# Patient Record
Sex: Male | Born: 1960 | Race: White | Hispanic: No | Marital: Single | Smoking: Current every day smoker
Health system: Southern US, Community
[De-identification: ages and names within clinical notes are randomized; demographics above are authoritative.]

## PROBLEM LIST (undated history)

## (undated) DIAGNOSIS — F329 Major depressive disorder, single episode, unspecified: Secondary | ICD-10-CM

## (undated) DIAGNOSIS — E785 Hyperlipidemia, unspecified: Secondary | ICD-10-CM

## (undated) DIAGNOSIS — R0601 Orthopnea: Secondary | ICD-10-CM

## (undated) DIAGNOSIS — G4730 Sleep apnea, unspecified: Secondary | ICD-10-CM

## (undated) DIAGNOSIS — J3489 Other specified disorders of nose and nasal sinuses: Secondary | ICD-10-CM

## (undated) DIAGNOSIS — I10 Essential (primary) hypertension: Secondary | ICD-10-CM

## (undated) DIAGNOSIS — F209 Schizophrenia, unspecified: Secondary | ICD-10-CM

## (undated) DIAGNOSIS — F410 Panic disorder [episodic paroxysmal anxiety] without agoraphobia: Secondary | ICD-10-CM

## (undated) DIAGNOSIS — G4700 Insomnia, unspecified: Secondary | ICD-10-CM

## (undated) DIAGNOSIS — R0600 Dyspnea, unspecified: Secondary | ICD-10-CM

## (undated) DIAGNOSIS — F32A Depression, unspecified: Secondary | ICD-10-CM

## (undated) HISTORY — PX: APPENDECTOMY: SHX54

## (undated) HISTORY — PX: NOSE SURGERY: SHX723

## (undated) HISTORY — DX: Schizophrenia, unspecified: F20.9

## (undated) HISTORY — DX: Panic disorder (episodic paroxysmal anxiety): F41.0

## (undated) HISTORY — DX: Hyperlipidemia, unspecified: E78.5

## (undated) HISTORY — DX: Essential (primary) hypertension: I10

---
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
## 1998-12-31 HISTORY — PX: NOSE SURGERY: SHX723

## 2013-05-02 LAB — COMPREHENSIVE METABOLIC PANEL
Albumin: 4.2 g/dL (ref 3.4–5.0)
Anion Gap: 8 (ref 7–16)
BUN: 7 mg/dL (ref 7–18)
Bilirubin,Total: 0.5 mg/dL (ref 0.2–1.0)
Co2: 25 mmol/L (ref 21–32)
EGFR (African American): >60
Osmolality: 277 (ref 275–301)
Potassium: 2.8 mmol/L — ABNORMAL LOW (ref 3.5–5.1)
SGOT(AST): 28 U/L (ref 15–37)
SGPT (ALT): 26 U/L (ref 12–78)
Sodium: 139 mmol/L (ref 136–145)
Total Protein: 8.2 g/dL (ref 6.4–8.2)

## 2013-05-02 LAB — URINALYSIS, COMPLETE
Blood: NEGATIVE
Ketone: NEGATIVE
Ph: 6 (ref 4.5–8.0)
Protein: NEGATIVE
RBC,UR: NONE SEEN /HPF (ref 0–5)
Specific Gravity: 1.003 (ref 1.003–1.030)
Squamous Epithelial: NONE SEEN
WBC UR: 1 /HPF (ref 0–5)

## 2013-05-02 LAB — CBC
HCT: 42.5 % (ref 40.0–52.0)
HGB: 14.6 g/dL (ref 13.0–18.0)
MCHC: 34.5 g/dL (ref 32.0–36.0)
MCV: 87 fL (ref 80–100)
Platelet: 336 10*3/uL (ref 150–440)
WBC: 11.7 10*3/uL — ABNORMAL HIGH (ref 3.8–10.6)

## 2013-05-02 LAB — DRUG SCREEN, URINE
Phencyclidine (PCP) Ur S: NEGATIVE (ref ?–25)
Tricyclic, Ur Screen: NEGATIVE (ref ?–1000)

## 2013-05-02 LAB — ETHANOL: Ethanol: <3 mg/dL

## 2013-05-02 LAB — TSH: Thyroid Stimulating Horm: 0.89 u[IU]/mL

## 2013-05-03 ENCOUNTER — Inpatient Hospital Stay: Payer: Self-pay | Admitting: Psychiatry
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
## 2018-01-09 ENCOUNTER — Ambulatory Visit: Payer: Medicaid Other | Admitting: Nurse Practitioner

## 2018-01-09 ENCOUNTER — Encounter: Payer: Self-pay | Admitting: Nurse Practitioner

## 2018-01-09 VITALS — BP 140/80 | HR 95 | Resp 16 | Ht 67.0 in | Wt 240.2 lb

## 2018-01-09 DIAGNOSIS — I10 Essential (primary) hypertension: Secondary | ICD-10-CM | POA: Diagnosis not present

## 2018-01-09 DIAGNOSIS — G4700 Insomnia, unspecified: Secondary | ICD-10-CM | POA: Insufficient documentation

## 2018-01-09 DIAGNOSIS — F251 Schizoaffective disorder, depressive type: Secondary | ICD-10-CM | POA: Diagnosis not present

## 2018-01-09 DIAGNOSIS — E782 Mixed hyperlipidemia: Secondary | ICD-10-CM | POA: Diagnosis not present

## 2018-01-09 DIAGNOSIS — F17210 Nicotine dependence, cigarettes, uncomplicated: Secondary | ICD-10-CM

## 2018-01-09 DIAGNOSIS — J309 Allergic rhinitis, unspecified: Secondary | ICD-10-CM

## 2018-01-09 DIAGNOSIS — J3489 Other specified disorders of nose and nasal sinuses: Secondary | ICD-10-CM | POA: Diagnosis not present

## 2018-01-09 MED ORDER — FLUTICASONE PROPIONATE 50 MCG/ACT NA SUSP: 1.0000 | Freq: Two times a day (BID) | NASAL | 3 refills | Status: DC

## 2018-01-09 MED ORDER — ZOLPIDEM TARTRATE 10 MG PO TABS: 10.0000 mg | ORAL_TABLET | Freq: Every day | ORAL | 3 refills | Status: DC
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
## 2018-01-15 ENCOUNTER — Encounter: Payer: Self-pay | Admitting: Nurse Practitioner

## 2018-05-16 ENCOUNTER — Encounter: Payer: Self-pay | Admitting: Nurse Practitioner

## 2018-05-16 ENCOUNTER — Ambulatory Visit: Payer: Medicaid Other | Admitting: Nurse Practitioner

## 2018-05-16 VITALS — BP 111/84 | HR 86 | Resp 16 | Ht 67.0 in | Wt 236.0 lb

## 2018-05-16 DIAGNOSIS — F251 Schizoaffective disorder, depressive type: Secondary | ICD-10-CM | POA: Diagnosis not present

## 2018-05-16 DIAGNOSIS — I10 Essential (primary) hypertension: Secondary | ICD-10-CM | POA: Diagnosis not present

## 2018-05-16 DIAGNOSIS — J309 Allergic rhinitis, unspecified: Secondary | ICD-10-CM

## 2018-05-16 DIAGNOSIS — F411 Generalized anxiety disorder: Secondary | ICD-10-CM

## 2018-05-16 DIAGNOSIS — G4700 Insomnia, unspecified: Secondary | ICD-10-CM

## 2018-05-16 MED ORDER — CITALOPRAM HYDROBROMIDE 20 MG PO TABS: 20.0000 mg | ORAL_TABLET | Freq: Every day | ORAL | 5 refills | Status: DC

## 2018-05-16 MED ORDER — FLUTICASONE PROPIONATE 50 MCG/ACT NA SUSP: 1.0000 | Freq: Two times a day (BID) | NASAL | 3 refills | Status: DC

## 2018-05-16 MED ORDER — DOXYCYCLINE HYCLATE 100 MG PO TABS: 100.0000 mg | ORAL_TABLET | Freq: Two times a day (BID) | ORAL | 0 refills | Status: DC

## 2018-05-16 MED ORDER — ZOLPIDEM TARTRATE 10 MG PO TABS: 10.0000 mg | ORAL_TABLET | Freq: Every day | ORAL | 3 refills | Status: DC

## 2018-05-16 MED ORDER — LISINOPRIL-HYDROCHLOROTHIAZIDE 20-25 MG PO TABS: 1.0000 | ORAL_TABLET | ORAL | 5 refills | Status: DC

## 2018-05-16 MED ORDER — AMLODIPINE BESYLATE 5 MG PO TABS: 5.0000 mg | ORAL_TABLET | Freq: Every evening | ORAL | 5 refills | Status: DC

## 2018-05-16 MED ORDER — ATORVASTATIN CALCIUM 10 MG PO TABS: 10.0000 mg | ORAL_TABLET | Freq: Every evening | ORAL | 5 refills | Status: DC

## 2018-05-16 MED ORDER — BUSPIRONE HCL 15 MG PO TABS: 15.0000 mg | ORAL_TABLET | Freq: Two times a day (BID) | ORAL | 5 refills | Status: DC

## 2018-05-16 NOTE — Progress Notes (Signed)
[facility] [place], [place] [address]  Internal MEDICINE  Office Visit Note  Patient Name: Dustin Heath  [id number]  [id number]  Date of Service: 06/10/2018   Pt is here for routine follow up.   Chief Complaint  Patient presents with  . Tick Removal  . Recurrent Skin Infections  . Hypertension    The patient noted a tick bite on upper abdomen a few days ago. He was able to remove the tick. Got a small, round, red rash around area of bite. It did scab over and this fell off yesterday. He has also noted two cyst like lesions. One on each inner thigh. Come and go. Get very sore and red. Sometimes they will drain, but ultimately always come back.  Needs refills on routine medications. Blood pressure is doing well. He feels good, overall.       Current Medication: Outpatient Encounter Medications as of 05/16/2018  Medication Sig  . amLODipine (NORVASC) 5 MG tablet Take 1 tablet (5 mg total) by mouth every evening. Take 1-2 tablets.  Marland Kitchen atorvastatin (LIPITOR) 10 MG tablet Take 1 tablet (10 mg total) by mouth every evening.  . busPIRone (BUSPAR) 15 MG tablet Take 1 tablet (15 mg total) by mouth 2 (two) times daily.  . citalopram (CELEXA) 20 MG tablet Take 1 tablet (20 mg total) by mouth daily.  Marland Kitchen doxycycline (VIBRA-TABS) 100 MG tablet Take 1 tablet (100 mg total) by mouth 2 (two) times daily.  . fluticasone (FLONASE) 50 MCG/ACT nasal spray Place 1 spray into both nostrils 2 (two) times daily.  Marland Kitchen lisinopril-hydrochlorothiazide (PRINZIDE,ZESTORETIC) 20-25 MG tablet Take 1 tablet by mouth every morning.  . zolpidem (AMBIEN) 10 MG tablet Take 1 tablet (10 mg total) by mouth at bedtime.  . [DISCONTINUED] amLODipine (NORVASC) 5 MG tablet Take 5 mg by mouth every evening. Take 1-2 tablets.  . [DISCONTINUED] atorvastatin (LIPITOR) 10 MG tablet Take 10 mg by mouth every evening.  . [DISCONTINUED] busPIRone (BUSPAR) 15 MG tablet Take 15 mg by mouth 2 (two) times daily.   . [DISCONTINUED] citalopram (CELEXA) 20 MG tablet Take 20 mg by mouth daily.  . [DISCONTINUED] fluticasone (FLONASE) 50 MCG/ACT nasal spray Place 1 spray into both nostrils 2 (two) times daily.  . [DISCONTINUED] lisinopril-hydrochlorothiazide (PRINZIDE,ZESTORETIC) 20-25 MG tablet Take 1 tablet by mouth every morning.  . [DISCONTINUED] zolpidem (AMBIEN) 10 MG tablet Take 1 tablet (10 mg total) by mouth at bedtime.   No facility-administered encounter medications on file as of 05/16/2018.     Surgical History: Past Surgical History:  Procedure Laterality Date  . APPENDECTOMY    . NOSE SURGERY      Medical History: Past Medical History:  Diagnosis Date  . Hyperlipidemia   . Hypertension   . Panic disorder   . Schizophrenia ([place])     Family History: History reviewed. No pertinent family history.  Social History   Socioeconomic History  . Marital status: Single    Spouse name: Not on file  . Number of children: Not on file  . Years of education: Not on file  . Highest education level: Not on file  Occupational History  . Not on file  Social Needs  . Financial resource strain: Not on file  . Food insecurity:    Worry: Not on file    Inability: Not on file  . Transportation needs:    Medical: Not on file    Non-medical: Not on file  Tobacco Use  .  Smoking status: Current Every Day Smoker    Packs/day: 1.00    Years: 40.00    Pack years: 40.00    Types: Cigarettes  . Smokeless tobacco: Never Used  Substance and Sexual Activity  . Alcohol use: Not on file  . Drug use: Not on file  . Sexual activity: Not on file  Lifestyle  . Physical activity:    Days per week: Not on file    Minutes per session: Not on file  . Stress: Not on file  Relationships  . Social connections:    Talks on phone: Not on file    Gets together: Not on file    Attends religious service: Not on file    Active member of club or organization: Not on file    Attends meetings of clubs or  organizations: Not on file    Relationship status: Not on file  . Intimate partner violence:    Fear of current or ex partner: Not on file    Emotionally abused: Not on file    Physically abused: Not on file    Forced sexual activity: Not on file  Other Topics Concern  . Not on file  Social History Narrative  . Not on file      Review of Systems  Constitutional: Positive for fatigue. Negative for activity change, appetite change, chills and unexpected weight change.  HENT: Negative for congestion, postnasal drip, rhinorrhea, sneezing and sore throat.        Chronic congestion with difficulty breathing out of left nostril. Has been like this since he suffered nasal fracture.   Eyes: Negative.  Negative for redness.  Respiratory: Positive for apnea. Negative for cough, chest tightness, shortness of breath and wheezing.   Cardiovascular: Negative for chest pain and palpitations.  Gastrointestinal: Negative for abdominal pain, constipation, diarrhea, nausea and vomiting.  Endocrine: Negative for cold intolerance, heat intolerance, polydipsia, polyphagia and polyuria.  Genitourinary: Negative.  Negative for dysuria and frequency.  Musculoskeletal: Negative for arthralgias, back pain, joint swelling and neck pain.  Skin: Positive for rash.       Tick bite just over the naval. Also two cyst like lesion. One on each upper, inner thigh.  Allergic/Immunologic: Positive for environmental allergies.  Neurological: Positive for headaches. Negative for tremors and numbness.  Hematological: Negative.  Negative for adenopathy. Does not bruise/bleed easily.  Psychiatric/Behavioral: Positive for sleep disturbance. Negative for behavioral problems (Depression) and suicidal ideas. The patient is nervous/anxious.    Today's Vitals   05/16/18 1337  BP: 111/84  Pulse: 86  Resp: 16  SpO2: 96%  Weight: 236 lb (107 kg)  Height: 5\' 7"  (1.702 m)    Physical Exam  Constitutional: He is oriented to  person, place, and time. He appears well-developed and well-nourished. No distress.  HENT:  Head: Normocephalic and atraumatic.  Nose: Rhinorrhea, nasal deformity and septal deviation present.  Mouth/Throat: Oropharynx is clear and moist. No oropharyngeal exudate.  Eyes: Pupils are equal, round, and reactive to light. Conjunctivae and EOM are normal.  Neck: Normal range of motion. Neck supple. No JVD present. Carotid bruit is not present. No tracheal deviation present. No thyromegaly present.  Cardiovascular: Normal rate, regular rhythm and normal heart sounds. Exam reveals no gallop and no friction rub.  No murmur heard. Pulmonary/Chest: Effort normal and breath sounds normal. No respiratory distress. He has no wheezes. He has no rales. He exhibits no tenderness.  Abdominal: Soft. Bowel sounds are normal. There is no tenderness.  Musculoskeletal: Normal range of motion.  Lymphadenopathy:    He has no cervical adenopathy.  Neurological: He is alert and oriented to person, place, and time. No cranial nerve deficit.  Skin: Skin is warm and dry. He is not diaphoretic.  Psychiatric: His speech is normal and behavior is normal. Judgment and thought content normal. His mood appears anxious. He exhibits a depressed mood.  Nursing note and vitals reviewed.  Assessment/Plan: 1. Essential (primary) hypertension Stable. Continue blood pressure medications as prescribed - amLODipine (NORVASC) 5 MG tablet; Take 1 tablet (5 mg total) by mouth every evening. Take 1-2 tablets.  Dispense: 30 tablet; Refill: 5 - lisinopril-hydrochlorothiazide (PRINZIDE,ZESTORETIC) 20-25 MG tablet; Take 1 tablet by mouth every morning.  Dispense: 30 tablet; Refill: 5  2. Allergic rhinitis, unspecified seasonality, unspecified trigger Add fluticasone nasal spray.  - fluticasone (FLONASE) 50 MCG/ACT nasal spray; Place 1 spray into both nostrils 2 (two) times daily.  Dispense: 16 g; Refill: 3  3. Insomnia, unspecified  type May continue to take ambien 10mg  at bedtime as needed. New rx sent today.  - zolpidem (AMBIEN) 10 MG tablet; Take 1 tablet (10 mg total) by mouth at bedtime.  Dispense: 30 tablet; Refill: 3  4. Schizoaffective disorder, depressive type ([place]) Well managed. Continue citalopram as prescribed.  - citalopram (CELEXA) 20 MG tablet; Take 1 tablet (20 mg total) by mouth daily.  Dispense: 30 tablet; Refill: 5  5. Generalized anxiety disorder Buspirone 15mg  twice daily as needed for acute anxiety.  - busPIRone (BUSPAR) 15 MG tablet; Take 1 tablet (15 mg total) by mouth 2 (two) times daily.  Dispense: 60 tablet; Refill: 5  General Counseling: Casanova verbalizes understanding of the findings of todays visit and agrees with plan of treatment. I have discussed any further diagnostic evaluation that may be needed or ordered today. We also reviewed his medications today. he has been encouraged to call the office with any questions or concerns that should arise related to todays visit.    Counseling:  Hypertension Counseling:   The following hypertensive lifestyle modification were recommended and discussed:  1. Limiting alcohol intake to less than 1 oz/day of ethanol:(24 oz of beer or 8 oz of wine or 2 oz of 100-proof whiskey). 2. Take baby ASA 81 mg daily. 3. Importance of regular aerobic exercise and losing weight. 4. Reduce dietary saturated fat and cholesterol intake for overall cardiovascular health. 5. Maintaining adequate dietary potassium, calcium, and magnesium intake. 6. Regular monitoring of the blood pressure. 7. Reduce sodium intake to less than 100 mmol/day (less than 2.3 gm of sodium or less than 6 gm of sodium choride)   This patient was seen by Leretha Pol, FNP- C in Collaboration with Dr Lavera Guise as a part of collaborative care agreement  Meds ordered this encounter  Medications  . amLODipine (NORVASC) 5 MG tablet    Sig: Take 1 tablet (5 mg total) by mouth every evening.  Take 1-2 tablets.    Dispense:  30 tablet    Refill:  5    Order Specific Question:   Supervising Provider    Answer:   Lavera Guise [[id number]]  . atorvastatin (LIPITOR) 10 MG tablet    Sig: Take 1 tablet (10 mg total) by mouth every evening.    Dispense:  30 tablet    Refill:  5    Order Specific Question:   Supervising Provider    Answer:   Lavera Guise [[id number]]  . busPIRone (BUSPAR) 15  MG tablet    Sig: Take 1 tablet (15 mg total) by mouth 2 (two) times daily.    Dispense:  60 tablet    Refill:  5    Order Specific Question:   Supervising Provider    Answer:   Lavera Guise [[id number]]  . citalopram (CELEXA) 20 MG tablet    Sig: Take 1 tablet (20 mg total) by mouth daily.    Dispense:  30 tablet    Refill:  5    Order Specific Question:   Supervising Provider    Answer:   Lavera Guise [[id number]]  . fluticasone (FLONASE) 50 MCG/ACT nasal spray    Sig: Place 1 spray into both nostrils 2 (two) times daily.    Dispense:  16 g    Refill:  3    Order Specific Question:   Supervising Provider    Answer:   Lavera Guise [[id number]]  . lisinopril-hydrochlorothiazide (PRINZIDE,ZESTORETIC) 20-25 MG tablet    Sig: Take 1 tablet by mouth every morning.    Dispense:  30 tablet    Refill:  5    Order Specific Question:   Supervising Provider    Answer:   Lavera Guise [[id number]]  . zolpidem (AMBIEN) 10 MG tablet    Sig: Take 1 tablet (10 mg total) by mouth at bedtime.    Dispense:  30 tablet    Refill:  3    Order Specific Question:   Supervising Provider    Answer:   Lavera Guise [[id number]]  . doxycycline (VIBRA-TABS) 100 MG tablet    Sig: Take 1 tablet (100 mg total) by mouth 2 (two) times daily.    Dispense:  20 tablet    Refill:  0    Order Specific Question:   Supervising Provider    Answer:   Lavera Guise [[id number]]    Time spent: 41 Minutes     Dr Lavera Guise Internal medicine

## 2018-06-10 DIAGNOSIS — F411 Generalized anxiety disorder: Secondary | ICD-10-CM | POA: Insufficient documentation

## 2018-09-08 ENCOUNTER — Other Ambulatory Visit: Payer: Self-pay | Admitting: Nurse Practitioner

## 2018-09-08 DIAGNOSIS — G4700 Insomnia, unspecified: Secondary | ICD-10-CM

## 2018-09-08 MED ORDER — ZOLPIDEM TARTRATE 10 MG PO TABS: 10.0000 mg | ORAL_TABLET | Freq: Every day | ORAL | 3 refills | Status: DC

## 2018-09-08 NOTE — Progress Notes (Signed)
Renewed zolpidem rx per pharmacy request.

## 2018-09-25 ENCOUNTER — Encounter: Payer: Self-pay | Admitting: Adult Health

## 2018-09-25 ENCOUNTER — Ambulatory Visit: Payer: Medicaid Other | Admitting: Adult Health

## 2018-09-25 VITALS — BP 170/90 | HR 102 | Resp 16 | Ht 67.0 in | Wt 233.8 lb

## 2018-09-25 DIAGNOSIS — E782 Mixed hyperlipidemia: Secondary | ICD-10-CM

## 2018-09-25 DIAGNOSIS — H538 Other visual disturbances: Secondary | ICD-10-CM

## 2018-09-25 DIAGNOSIS — Z0001 Encounter for general adult medical examination with abnormal findings: Secondary | ICD-10-CM

## 2018-09-25 DIAGNOSIS — I10 Essential (primary) hypertension: Secondary | ICD-10-CM | POA: Diagnosis not present

## 2018-09-25 DIAGNOSIS — F411 Generalized anxiety disorder: Secondary | ICD-10-CM

## 2018-09-25 DIAGNOSIS — F17210 Nicotine dependence, cigarettes, uncomplicated: Secondary | ICD-10-CM

## 2018-09-25 DIAGNOSIS — R300 Dysuria: Secondary | ICD-10-CM

## 2018-09-25 DIAGNOSIS — G4700 Insomnia, unspecified: Secondary | ICD-10-CM

## 2018-09-25 DIAGNOSIS — Z125 Encounter for screening for malignant neoplasm of prostate: Secondary | ICD-10-CM

## 2018-09-25 DIAGNOSIS — F251 Schizoaffective disorder, depressive type: Secondary | ICD-10-CM

## 2018-09-25 NOTE — Patient Instructions (Signed)

## 2018-09-25 NOTE — Progress Notes (Signed)
[facility] [place], [place] [address]  Internal MEDICINE  Office Visit Note  Patient Name: Dustin Heath  [id number]  [id number]  Date of Service: 09/28/2018  Chief Complaint  Patient presents with  . Annual Exam  . Hypertension  . Hyperlipidemia  . Blurred Vision    blurred vision has got worse  . Anxiety     HPI Pt is here for routine health maintenance examination. He is a well appearing 58 yo male.  He has a history of HTN, HLD, Anxiety.  He reports he is currently having blurred vision, that has gotten worse over last 4 days.  His blood pressure is elevated today. He reports blurred vision has been present in one eye for a few years. Now his left eye is becoming blurry.  Pt is unsure of the diagnosis.  He needs to see and eye doctor, because he thinks he has macular degeneration. He has significant anxiety and this is most likely why his blood pressure is elevated. Pt reports he is taking a small part of his Buspar.  He reports smoking 1 PPD of cigarettes/ cigars. He denies alcohol or illicit drug use.      Current Medication: Outpatient Encounter Medications as of 09/25/2018  Medication Sig  . amLODipine (NORVASC) 5 MG tablet Take 1 tablet (5 mg total) by mouth every evening. Take 1-2 tablets.  Marland Kitchen atorvastatin (LIPITOR) 10 MG tablet Take 1 tablet (10 mg total) by mouth every evening.  . busPIRone (BUSPAR) 15 MG tablet Take 1 tablet (15 mg total) by mouth 2 (two) times daily.  . citalopram (CELEXA) 20 MG tablet Take 1 tablet (20 mg total) by mouth daily.  . fluticasone (FLONASE) 50 MCG/ACT nasal spray Place 1 spray into both nostrils 2 (two) times daily.  Marland Kitchen lisinopril-hydrochlorothiazide (PRINZIDE,ZESTORETIC) 20-25 MG tablet Take 1 tablet by mouth every morning.  . zolpidem (AMBIEN) 10 MG tablet Take 1 tablet (10 mg total) by mouth at bedtime.  . [DISCONTINUED] doxycycline (VIBRA-TABS) 100 MG tablet Take 1 tablet (100 mg total) by mouth 2 (two) times  daily. (Patient not taking: Reported on 09/25/2018)   No facility-administered encounter medications on file as of 09/25/2018.     Surgical History: Past Surgical History:  Procedure Laterality Date  . APPENDECTOMY    . NOSE SURGERY      Medical History: Past Medical History:  Diagnosis Date  . Hyperlipidemia   . Hypertension   . Panic disorder   . Schizophrenia ([place])     Family History: Family History  Problem Relation Age of Onset  . Hypertension Mother   . Hypertension Father       Review of Systems  Constitutional: Negative.  Negative for chills, fatigue and unexpected weight change.  HENT: Negative.  Negative for congestion, rhinorrhea, sneezing and sore throat.   Eyes: Negative for redness.  Respiratory: Negative.  Negative for cough, chest tightness and shortness of breath.   Cardiovascular: Negative.  Negative for chest pain and palpitations.  Gastrointestinal: Negative.  Negative for abdominal pain, constipation, diarrhea, nausea and vomiting.  Endocrine: Negative.   Genitourinary: Negative.  Negative for dysuria and frequency.  Musculoskeletal: Negative.  Negative for arthralgias, back pain, joint swelling and neck pain.  Skin: Negative.  Negative for rash.  Allergic/Immunologic: Negative.   Neurological: Negative.  Negative for tremors and numbness.  Hematological: Negative for adenopathy. Does not bruise/bleed easily.  Psychiatric/Behavioral: Negative.  Negative for behavioral problems, sleep disturbance and suicidal ideas. The patient is  not nervous/anxious.      Vital Signs: BP (!) 170/90   Pulse (!) 102   Resp 16   Ht 5\' 7"  (1.702 m)   Wt 233 lb 12.8 oz (106.1 kg)   SpO2 98%   BMI 36.62 kg/m    Physical Exam  Constitutional: He is oriented to person, place, and time. He appears well-developed and well-nourished. No distress.  HENT:  Head: Normocephalic and atraumatic.  Mouth/Throat: Oropharynx is clear and moist. No oropharyngeal exudate.   Eyes: Pupils are equal, round, and reactive to light. EOM are normal.  Neck: Normal range of motion. Neck supple. No JVD present. No tracheal deviation present. No thyromegaly present.  Cardiovascular: Normal rate, regular rhythm and normal heart sounds. Exam reveals no gallop and no friction rub.  No murmur heard. Pulmonary/Chest: Effort normal and breath sounds normal. No respiratory distress. He has no wheezes. He has no rales. He exhibits no tenderness.  Abdominal: Soft. There is no tenderness. There is no guarding.  Musculoskeletal: Normal range of motion.  Lymphadenopathy:    He has no cervical adenopathy.  Neurological: He is alert and oriented to person, place, and time. No cranial nerve deficit.  Skin: Skin is warm and dry. He is not diaphoretic.  Psychiatric: He has a normal mood and affect. His behavior is normal. Judgment and thought content normal.  Nursing note and vitals reviewed.   LABS: Recent Results (from the past 2160 hour(s))  UA/M w/rflx Culture, Routine     Status: None   Collection Time: 09/25/18  2:57 PM  Result Value Ref Range   Specific Gravity, UA 1.007 1.005 - 1.030   pH, UA 6.0 5.0 - 7.5   Color, UA Yellow Yellow   Appearance Ur Clear Clear   Leukocytes, UA Negative Negative   Protein, UA Negative Negative/Trace   Glucose, UA Negative Negative   Ketones, UA Negative Negative   RBC, UA Negative Negative   Bilirubin, UA Negative Negative   Urobilinogen, Ur 0.2 0.2 - 1.0 mg/dL   Nitrite, UA Negative Negative   Microscopic Examination Comment     Comment: Microscopic follows if indicated.   Microscopic Examination See below:     Comment: Microscopic was indicated and was performed.   Urinalysis Reflex Comment     Comment: This specimen will not reflex to a Urine Culture.  Microscopic Examination     Status: None   Collection Time: 09/25/18  2:57 PM  Result Value Ref Range   WBC, UA 0-5 0 - 5 /hpf   RBC, UA None seen 0 - 2 /hpf   Epithelial Cells  (non renal) None seen 0 - 10 /hpf   Casts None seen None seen /lpf   Mucus, UA Present Not Estab.   Bacteria, UA None seen None seen/Few     Assessment/Plan: 1. Encounter for general adult medical examination with abnormal findings Up to date on PHM. - CBC with Differential/Platelet - Lipid Panel With LDL/HDL Ratio - TSH - T4, free - Comprehensive metabolic panel  2. Blurred vision, bilateral Been present in one eye for years, now second eye is blurry.  He reports a history of macular degeneration -Amb referral to Opthamology.   3. Essential (primary) hypertension Elevated today, Encouraged medication compliance.  Also, discussed  Repeat BP 140/90.   Hypertension Counseling:  The following hypertensive lifestyle modification were recommended and discussed:  1. Limiting alcohol intake to less than 1 oz/day of ethanol:(24 oz of beer or 8 oz of wine  or 2 oz of 100-proof whiskey). 2. Take baby ASA 81 mg daily. 3. Importance of regular aerobic exercise and losing weight. 4. Reduce dietary saturated fat and cholesterol intake for overall cardiovascular health. 5. Maintaining adequate dietary potassium, calcium, and magnesium intake. 6. Regular monitoring of the blood pressure. 7. Reduce sodium intake to less than 100 mmol/day (less than 2.3 gm of sodium or less than 6 gm of sodium choride)   4. Mixed hyperlipidemia Will check lipid level.  5. Insomnia, unspecified type Continue taking Ambien as prescribed. Encouraged patient to take whole pill, as he is also only taking part.  6. Generalized anxiety disorder Continue current therapy of Celexa, and Buspar. Encouraged patient to take Buspar as prescribed.  7. Schizoaffective disorder, depressive type ([place]) Does not see Psych anymore.  Has not had any recent hospitalizations  8. Nicotine dependence, cigarettes, uncomplicated Smoking cessation counseling: 1. Pt acknowledges the risks of long term smoking, she will try to quite  smoking. 2. Options for different medications including nicotine products, chewing gum, patch etc, Wellbutrin and Chantix is discussed 3. Goal and date of compete cessation is discussed 4. Total time spent in smoking cessation is 15 min.  9. Dysuria - UA/M w/rflx Culture, Routine  10. Screening for prostate cancer - PSA  General Counseling: Iva verbalizes understanding of the findings of todays visit and agrees with plan of treatment. I have discussed any further diagnostic evaluation that may be needed or ordered today. We also reviewed his medications today. he has been encouraged to call the office with any questions or concerns that should arise related to todays visit.   Orders Placed This Encounter  Procedures  . Microscopic Examination  . UA/M w/rflx Culture, Routine  . CBC with Differential/Platelet  . Lipid Panel With LDL/HDL Ratio  . TSH  . T4, free  . Comprehensive metabolic panel  . PSA  . Ambulatory referral to Ophthalmology    No orders of the defined types were placed in this encounter.   Time spent: 25 Minutes   This patient was seen by Orson Gear AGNP-C in Collaboration with Dr Lavera Guise as a part of collaborative care agreement   Lavera Guise, MD  Internal Medicine

## 2018-09-26 LAB — MICROSCOPIC EXAMINATION
Bacteria, UA: NONE SEEN
CASTS: NONE SEEN /LPF
EPITHELIAL CELLS (NON RENAL): NONE SEEN /HPF (ref 0–10)
RBC, UA: NONE SEEN /hpf (ref 0–2)

## 2018-09-26 LAB — UA/M W/RFLX CULTURE, ROUTINE
Bilirubin, UA: NEGATIVE
Glucose, UA: NEGATIVE
Ketones, UA: NEGATIVE
Leukocytes, UA: NEGATIVE
Nitrite, UA: NEGATIVE
PH UA: 6 (ref 5.0–7.5)
PROTEIN UA: NEGATIVE
RBC UA: NEGATIVE
SPEC GRAV UA: 1.007 (ref 1.005–1.030)
Urobilinogen, Ur: 0.2 mg/dL (ref 0.2–1.0)

## 2018-11-04 ENCOUNTER — Other Ambulatory Visit: Payer: Self-pay | Admitting: Nurse Practitioner

## 2018-11-04 DIAGNOSIS — F411 Generalized anxiety disorder: Secondary | ICD-10-CM

## 2018-11-04 DIAGNOSIS — F251 Schizoaffective disorder, depressive type: Secondary | ICD-10-CM

## 2018-11-26 ENCOUNTER — Ambulatory Visit: Payer: Self-pay | Admitting: Adult Health

## 2018-12-06 LAB — CBC WITH DIFFERENTIAL/PLATELET
Basophils Absolute: 0.1 10*3/uL (ref 0.0–0.2)
Basos: 1 %
EOS (ABSOLUTE): 0.1 10*3/uL (ref 0.0–0.4)
Eos: 1 %
Hematocrit: 45.1 % (ref 37.5–51.0)
Hemoglobin: 15.2 g/dL (ref 13.0–17.7)
IMMATURE GRANS (ABS): 0 10*3/uL (ref 0.0–0.1)
Immature Granulocytes: 0 %
LYMPHS: 29 %
Lymphocytes Absolute: 2.4 10*3/uL (ref 0.7–3.1)
MCH: 30.4 pg (ref 26.6–33.0)
MCHC: 33.7 g/dL (ref 31.5–35.7)
MCV: 90 fL (ref 79–97)
Monocytes Absolute: 0.7 10*3/uL (ref 0.1–0.9)
Monocytes: 8 %
NEUTROS ABS: 5 10*3/uL (ref 1.4–7.0)
NEUTROS PCT: 61 %
PLATELETS: 405 10*3/uL (ref 150–450)
RBC: 5 x10E6/uL (ref 4.14–5.80)
RDW: 12.5 % (ref 12.3–15.4)
WBC: 8.2 10*3/uL (ref 3.4–10.8)

## 2018-12-06 LAB — COMPREHENSIVE METABOLIC PANEL
ALK PHOS: 63 IU/L (ref 39–117)
ALT: 19 IU/L (ref 0–44)
AST: 22 IU/L (ref 0–40)
Albumin/Globulin Ratio: 1.7 (ref 1.2–2.2)
Albumin: 4.7 g/dL (ref 3.5–5.5)
BILIRUBIN TOTAL: 0.6 mg/dL (ref 0.0–1.2)
BUN / CREAT RATIO: 12 (ref 9–20)
BUN: 13 mg/dL (ref 6–24)
CHLORIDE: 95 mmol/L — AB (ref 96–106)
CO2: 22 mmol/L (ref 20–29)
Calcium: 9.7 mg/dL (ref 8.7–10.2)
Creatinine, Ser: 1.13 mg/dL (ref 0.76–1.27)
GFR calc non Af Amer: 71 mL/min/{1.73_m2} (ref >59)
GFR, EST AFRICAN AMERICAN: 82 mL/min/{1.73_m2} (ref >59)
GLUCOSE: 122 mg/dL — AB (ref 65–99)
Globulin, Total: 2.7 g/dL (ref 1.5–4.5)
POTASSIUM: 4 mmol/L (ref 3.5–5.2)
Sodium: 134 mmol/L (ref 134–144)
Total Protein: 7.4 g/dL (ref 6.0–8.5)

## 2018-12-06 LAB — T4, FREE: FREE T4: 1.82 ng/dL — AB (ref 0.82–1.77)

## 2018-12-06 LAB — LIPID PANEL WITH LDL/HDL RATIO
Cholesterol, Total: 147 mg/dL (ref 100–199)
HDL: 32 mg/dL — ABNORMAL LOW (ref >39)
LDL CALC: 77 mg/dL (ref 0–99)
LDL/HDL RATIO: 2.4 ratio (ref 0.0–3.6)
Triglycerides: 189 mg/dL — ABNORMAL HIGH (ref 0–149)
VLDL CHOLESTEROL CAL: 38 mg/dL (ref 5–40)

## 2018-12-06 LAB — PSA: PROSTATE SPECIFIC AG, SERUM: 0.6 ng/mL (ref 0.0–4.0)

## 2018-12-06 LAB — TSH: TSH: 1.74 u[IU]/mL (ref 0.450–4.500)

## 2018-12-17 ENCOUNTER — Ambulatory Visit: Payer: Medicaid Other | Admitting: Adult Health

## 2018-12-17 ENCOUNTER — Encounter: Payer: Self-pay | Admitting: Adult Health

## 2018-12-17 VITALS — BP 118/72 | HR 110 | Resp 16 | Ht 67.0 in | Wt 233.0 lb

## 2018-12-17 DIAGNOSIS — F17210 Nicotine dependence, cigarettes, uncomplicated: Secondary | ICD-10-CM | POA: Diagnosis not present

## 2018-12-17 DIAGNOSIS — G4700 Insomnia, unspecified: Secondary | ICD-10-CM | POA: Diagnosis not present

## 2018-12-17 DIAGNOSIS — E782 Mixed hyperlipidemia: Secondary | ICD-10-CM

## 2018-12-17 DIAGNOSIS — I10 Essential (primary) hypertension: Secondary | ICD-10-CM

## 2018-12-17 MED ORDER — LISINOPRIL-HYDROCHLOROTHIAZIDE 20-25 MG PO TABS: 1.0000 | ORAL_TABLET | ORAL | 5 refills | Status: DC

## 2018-12-17 MED ORDER — ZOLPIDEM TARTRATE 10 MG PO TABS: 10.0000 mg | ORAL_TABLET | Freq: Every day | ORAL | 3 refills | Status: DC

## 2018-12-17 MED ORDER — AMLODIPINE BESYLATE 5 MG PO TABS: ORAL_TABLET | ORAL | 0 refills | Status: DC

## 2018-12-17 MED ORDER — NICOTINE POLACRILEX 4 MG MT GUM: 4.0000 mg | CHEWING_GUM | OROMUCOSAL | 0 refills | Status: DC | PRN

## 2018-12-17 NOTE — Progress Notes (Signed)
[facility] [place], [place] [address]  Internal MEDICINE  Office Visit Note  Patient Name: Dustin Heath  [id number]  [id number]  Date of Service: 12/21/2018  Chief Complaint  Patient presents with  . Hypertension    lab follow up  . Hyperlipidemia    HPI  Pt is here for follow up on HTN and HLD.  He is here to review his lab work. Pt reports his blood pressure is well controlled.  His lipid panel shows moderately controlled HLD using Lipitor.  Pt reports he has been taking lipitor x 2 years.  He denies any current pressing issues.  He would like some nicotine gum to assist with smoking cessation.  He is also requesting refills on some medications.  Current Medication: Outpatient Encounter Medications as of 12/17/2018  Medication Sig  . amLODipine (NORVASC) 5 MG tablet TAKE 1-2 TABLETS BY MOUTH EVERY EVENING FOR BLOOD PRESSURE  . atorvastatin (LIPITOR) 10 MG tablet Take 1 tablet (10 mg total) by mouth every evening.  . busPIRone (BUSPAR) 15 MG tablet TAKE 1 TABLET (15 MG TOTAL) BY MOUTH 2 (TWO) TIMES DAILY.  . citalopram (CELEXA) 20 MG tablet TAKE 1 TABLET (20 MG TOTAL) BY MOUTH DAILY.  . fluticasone (FLONASE) 50 MCG/ACT nasal spray Place 1 spray into both nostrils 2 (two) times daily.  Marland Kitchen lisinopril-hydrochlorothiazide (PRINZIDE,ZESTORETIC) 20-25 MG tablet Take 1 tablet by mouth every morning.  . zolpidem (AMBIEN) 10 MG tablet Take 1 tablet (10 mg total) by mouth at bedtime.  . [DISCONTINUED] amLODipine (NORVASC) 5 MG tablet Take 1 tablet (5 mg total) by mouth every evening. Take 1-2 tablets.  . [DISCONTINUED] amLODipine (NORVASC) 5 MG tablet TAKE 1-2 TABLETS BY MOUTH EVERY EVENING FOR BLOOD PRESSURE  . [DISCONTINUED] lisinopril-hydrochlorothiazide (PRINZIDE,ZESTORETIC) 20-25 MG tablet Take 1 tablet by mouth every morning.  . [DISCONTINUED] zolpidem (AMBIEN) 10 MG tablet Take 1 tablet (10 mg total) by mouth at bedtime.  . nicotine polacrilex (NICORETTE  STARTER KIT) 4 MG gum Take 1 each (4 mg total) by mouth as needed for smoking cessation.   No facility-administered encounter medications on file as of 12/17/2018.     Surgical History: Past Surgical History:  Procedure Laterality Date  . APPENDECTOMY    . NOSE SURGERY      Medical History: Past Medical History:  Diagnosis Date  . Hyperlipidemia   . Hypertension   . Panic disorder   . Schizophrenia ([place])     Family History: Family History  Problem Relation Age of Onset  . Hypertension Mother   . Hypertension Father     Social History   Socioeconomic History  . Marital status: Single    Spouse name: Not on file  . Number of children: Not on file  . Years of education: Not on file  . Highest education level: Not on file  Occupational History  . Not on file  Social Needs  . Financial resource strain: Not on file  . Food insecurity:    Worry: Not on file    Inability: Not on file  . Transportation needs:    Medical: Not on file    Non-medical: Not on file  Tobacco Use  . Smoking status: Current Every Day Smoker    Packs/day: 1.00    Years: 40.00    Pack years: 40.00    Types: Cigarettes  . Smokeless tobacco: Never Used  Substance and Sexual Activity  . Alcohol use: Never    Frequency: Never  .  Drug use: Never  . Sexual activity: Not on file  Lifestyle  . Physical activity:    Days per week: Not on file    Minutes per session: Not on file  . Stress: Not on file  Relationships  . Social connections:    Talks on phone: Not on file    Gets together: Not on file    Attends religious service: Not on file    Active member of club or organization: Not on file    Attends meetings of clubs or organizations: Not on file    Relationship status: Not on file  . Intimate partner violence:    Fear of current or ex partner: Not on file    Emotionally abused: Not on file    Physically abused: Not on file    Forced sexual activity: Not on file  Other Topics  Concern  . Not on file  Social History Narrative  . Not on file      Review of Systems  Constitutional: Negative.  Negative for chills, fatigue and unexpected weight change.  HENT: Negative.  Negative for congestion, rhinorrhea, sneezing and sore throat.   Eyes: Negative for redness.  Respiratory: Negative.  Negative for cough, chest tightness and shortness of breath.   Cardiovascular: Negative.  Negative for chest pain and palpitations.  Gastrointestinal: Negative.  Negative for abdominal pain, constipation, diarrhea, nausea and vomiting.  Endocrine: Negative.   Genitourinary: Negative.  Negative for dysuria and frequency.  Musculoskeletal: Negative.  Negative for arthralgias, back pain, joint swelling and neck pain.  Skin: Negative.  Negative for rash.  Allergic/Immunologic: Negative.   Neurological: Negative.  Negative for tremors and numbness.  Hematological: Negative for adenopathy. Does not bruise/bleed easily.  Psychiatric/Behavioral: Negative.  Negative for behavioral problems, sleep disturbance and suicidal ideas. The patient is not nervous/anxious.     Vital Signs: BP 118/72 (BP Location: Left Arm, Patient Position: Sitting, Cuff Size: Normal)   Pulse (!) 110   Resp 16   Ht '5\' 7"'  (1.702 m)   Wt 233 lb (105.7 kg)   SpO2 92%   BMI 36.49 kg/m    Physical Exam Vitals signs and nursing note reviewed.  Constitutional:      General: He is not in acute distress.    Appearance: He is well-developed. He is not diaphoretic.  HENT:     Head: Normocephalic and atraumatic.     Mouth/Throat:     Pharynx: No oropharyngeal exudate.  Eyes:     Pupils: Pupils are equal, round, and reactive to light.  Neck:     Musculoskeletal: Normal range of motion and neck supple.     Thyroid: No thyromegaly.     Vascular: No JVD.     Trachea: No tracheal deviation.  Cardiovascular:     Rate and Rhythm: Normal rate and regular rhythm.     Heart sounds: Normal heart sounds. No murmur. No  friction rub. No gallop.   Pulmonary:     Effort: Pulmonary effort is normal. No respiratory distress.     Breath sounds: Normal breath sounds. No wheezing or rales.  Chest:     Chest wall: No tenderness.  Abdominal:     Palpations: Abdomen is soft.     Tenderness: There is no abdominal tenderness. There is no guarding.  Musculoskeletal: Normal range of motion.  Lymphadenopathy:     Cervical: No cervical adenopathy.  Skin:    General: Skin is warm and dry.  Neurological:  Mental Status: He is alert and oriented to person, place, and time.     Cranial Nerves: No cranial nerve deficit.  Psychiatric:        Behavior: Behavior normal.        Thought Content: Thought content normal.        Judgment: Judgment normal.    Assessment/Plan: 1. Essential (primary) hypertension Stable at this time.  Patient should continue amlodipine and lisinopril hydrochlorothiazide as prescribed. - amLODipine (NORVASC) 5 MG tablet; TAKE 1-2 TABLETS BY MOUTH EVERY EVENING FOR BLOOD PRESSURE  Dispense: 180 tablet; Refill: 0 - lisinopril-hydrochlorothiazide (PRINZIDE,ZESTORETIC) 20-25 MG tablet; Take 1 tablet by mouth every morning.  Dispense: 30 tablet; Refill: 5  2. Mixed hyperlipidemia Stable, continue to follow lipid panel.  3. Insomnia, unspecified type Patient provided with new prescription for Ambien. - zolpidem (AMBIEN) 10 MG tablet; Take 1 tablet (10 mg total) by mouth at bedtime.  Dispense: 30 tablet; Refill: 3 Refilled Controlled medications today. Reviewed risks and possible side effects associated with taking Stimulants. Combination of these drugs with other psychotropic medications could cause dizziness and drowsiness. Pt needs to Monitor symptoms and exercise caution in driving and operating heavy machinery to avoid damages to oneself, to others and to the surroundings. Patient verbalized understanding in this matter. Dependence and abuse for these drugs will be monitored closely. A  Controlled substance policy and procedure is on file which allows [facility] to order a urine drug screen test at any visit. Patient understands and agrees with the plan..  4. Nicotine dependence, cigarettes, uncomplicated Smoking cessation counseling: 1. Pt acknowledges the risks of long term smoking, she will try to quite smoking. 2. Options for different medications including nicotine products, chewing gum, patch etc, Wellbutrin and Chantix is discussed 3. Goal and date of compete cessation is discussed 4. Total time spent in smoking cessation is 15 min.  - nicotine polacrilex (NICORETTE STARTER KIT) 4 MG gum; Take 1 each (4 mg total) by mouth as needed for smoking cessation.  Dispense: 100 tablet; Refill: 0  General Counseling: Yehia verbalizes understanding of the findings of todays visit and agrees with plan of treatment. I have discussed any further diagnostic evaluation that may be needed or ordered today. We also reviewed his medications today. he has been encouraged to call the office with any questions or concerns that should arise related to todays visit.    No orders of the defined types were placed in this encounter.   Meds ordered this encounter  Medications  . zolpidem (AMBIEN) 10 MG tablet    Sig: Take 1 tablet (10 mg total) by mouth at bedtime.    Dispense:  30 tablet    Refill:  3  . amLODipine (NORVASC) 5 MG tablet    Sig: TAKE 1-2 TABLETS BY MOUTH EVERY EVENING FOR BLOOD PRESSURE    Dispense:  180 tablet    Refill:  0    Prescription Expired  . lisinopril-hydrochlorothiazide (PRINZIDE,ZESTORETIC) 20-25 MG tablet    Sig: Take 1 tablet by mouth every morning.    Dispense:  30 tablet    Refill:  5  . nicotine polacrilex (NICORETTE STARTER KIT) 4 MG gum    Sig: Take 1 each (4 mg total) by mouth as needed for smoking cessation.    Dispense:  100 tablet    Refill:  0    Time spent: 25 Minutes   This patient was seen by Orson Gear AGNP-C in  Collaboration with Dr Timoteo Gaul  Humphrey Rolls as a part of collaborative care agreement     Kendell Bane AGNP-C Internal medicine

## 2018-12-17 NOTE — Patient Instructions (Signed)
Coping with Quitting Smoking  Quitting smoking is a physical and mental challenge. You will face cravings, withdrawal symptoms, and temptation. Before quitting, work with your health care provider to make a plan that can help you cope. Preparation can help you quit and keep you from giving in. How can I cope with cravings? Cravings usually last for 5-10 minutes. If you get through it, the craving will pass. Consider taking the following actions to help you cope with cravings:  Keep your mouth busy: ? Chew sugar-free gum. ? Suck on hard candies or a straw. ? Brush your teeth.  Keep your hands and body busy: ? Immediately change to a different activity when you feel a craving. ? Squeeze or play with a ball. ? Do an activity or a hobby, like making bead jewelry, practicing needlepoint, or working with wood. ? Mix up your normal routine. ? Take a short exercise break. Go for a quick walk or run up and down stairs. ? Spend time in public places where smoking is not allowed.  Focus on doing something kind or helpful for someone else.  Call a friend or family member to talk during a craving.  Join a support group.  Call a quit line, such as 1-800-QUIT-NOW.  Talk with your health care provider about medicines that might help you cope with cravings and make quitting easier for you. How can I deal with withdrawal symptoms? Your body may experience negative effects as it tries to get used to not having nicotine in the system. These effects are called withdrawal symptoms. They may include:  Feeling hungrier than normal.  Trouble concentrating.  Irritability.  Trouble sleeping.  Feeling depressed.  Restlessness and agitation.  Craving a cigarette. To manage withdrawal symptoms:  Avoid places, people, and activities that trigger your cravings.  Remember why you want to quit.  Get plenty of sleep.  Avoid coffee and other caffeinated drinks. These may worsen some of your symptoms.  How can I handle social situations? Social situations can be difficult when you are quitting smoking, especially in the first few weeks. To manage this, you can:  Avoid parties, bars, and other social situations where people might be smoking.  Avoid alcohol.  Leave right away if you have the urge to smoke.  Explain to your family and friends that you are quitting smoking. Ask for understanding and support.  Plan activities with friends or family where smoking is not an option. What are some ways I can cope with stress? Wanting to smoke may cause stress, and stress can make you want to smoke. Find ways to manage your stress. Relaxation techniques can help. For example:  Breathe slowly and deeply, in through your nose and out through your mouth.  Listen to soothing, relaxing music.  Talk with a family member or friend about your stress.  Light a candle.  Soak in a bath or take a shower.  Think about a peaceful place. What are some ways I can prevent weight gain? Be aware that many people gain weight after they quit smoking. However, not everyone does. To keep from gaining weight, have a plan in place before you quit and stick to the plan after you quit. Your plan should include:  Having healthy snacks. When you have a craving, it may help to: ? Eat plain popcorn, crunchy carrots, celery, or other cut vegetables. ? Chew sugar-free gum.  Changing how you eat: ? Eat small portion sizes at meals. ? Eat 4-6 small meals   throughout the day instead of 1-2 large meals a day. ? Be mindful when you eat. Do not watch television or do other things that might distract you as you eat.  Exercising regularly: ? Make time to exercise each day. If you do not have time for a long workout, do short bouts of exercise for 5-10 minutes several times a day. ? Do some form of strengthening exercise, like weight lifting, and some form of aerobic exercise, like running or swimming.  Drinking plenty of  water or other low-calorie or no-calorie drinks. Drink 6-8 glasses of water daily, or as much as instructed by your health care provider. Summary  Quitting smoking is a physical and mental challenge. You will face cravings, withdrawal symptoms, and temptation to smoke again. Preparation can help you as you go through these challenges.  You can cope with cravings by keeping your mouth busy (such as by chewing gum), keeping your body and hands busy, and making calls to family, friends, or a helpline for people who want to quit smoking.  You can cope with withdrawal symptoms by avoiding places where people smoke, avoiding drinks with caffeine, and getting plenty of rest.  Ask your health care provider about the different ways to prevent weight gain, avoid stress, and handle social situations. This information is not intended to replace advice given to you by your health care provider. Make sure you discuss any questions you have with your health care provider. Document Released: 12/14/2016 Document Revised: 12/14/2016 Document Reviewed: 12/14/2016 Elsevier Interactive Patient Education  2019 Elsevier Inc.  

## 2019-01-02 ENCOUNTER — Other Ambulatory Visit: Payer: Self-pay

## 2019-01-02 DIAGNOSIS — J309 Allergic rhinitis, unspecified: Secondary | ICD-10-CM

## 2019-01-02 MED ORDER — FLUTICASONE PROPIONATE 50 MCG/ACT NA SUSP: 1.0000 | Freq: Two times a day (BID) | NASAL | 3 refills | Status: DC

## 2019-01-09 ENCOUNTER — Other Ambulatory Visit: Payer: Self-pay

## 2019-01-09 DIAGNOSIS — J309 Allergic rhinitis, unspecified: Secondary | ICD-10-CM

## 2019-01-09 MED ORDER — FLUTICASONE PROPIONATE 50 MCG/ACT NA SUSP: 1.0000 | Freq: Two times a day (BID) | NASAL | 3 refills | Status: DC

## 2019-04-16 ENCOUNTER — Other Ambulatory Visit: Payer: Self-pay | Admitting: Adult Health

## 2019-04-16 DIAGNOSIS — G4700 Insomnia, unspecified: Secondary | ICD-10-CM

## 2019-04-20 ENCOUNTER — Other Ambulatory Visit: Payer: Self-pay

## 2019-04-20 ENCOUNTER — Ambulatory Visit (INDEPENDENT_AMBULATORY_CARE_PROVIDER_SITE_OTHER): Payer: Medicaid Other | Admitting: Nurse Practitioner

## 2019-04-20 ENCOUNTER — Encounter: Payer: Self-pay | Admitting: Nurse Practitioner

## 2019-04-20 VITALS — BP 130/85 | HR 64 | Ht 67.0 in | Wt 225.0 lb

## 2019-04-20 DIAGNOSIS — G4700 Insomnia, unspecified: Secondary | ICD-10-CM | POA: Diagnosis not present

## 2019-04-20 DIAGNOSIS — I10 Essential (primary) hypertension: Secondary | ICD-10-CM | POA: Diagnosis not present

## 2019-04-20 DIAGNOSIS — F251 Schizoaffective disorder, depressive type: Secondary | ICD-10-CM

## 2019-04-20 DIAGNOSIS — E782 Mixed hyperlipidemia: Secondary | ICD-10-CM

## 2019-04-20 MED ORDER — LISINOPRIL-HYDROCHLOROTHIAZIDE 20-25 MG PO TABS: 1.0000 | ORAL_TABLET | ORAL | 3 refills | Status: DC

## 2019-04-20 MED ORDER — ATORVASTATIN CALCIUM 10 MG PO TABS: 10.0000 mg | ORAL_TABLET | Freq: Every evening | ORAL | 3 refills | Status: DC

## 2019-04-20 MED ORDER — ZOLPIDEM TARTRATE 10 MG PO TABS: 10.0000 mg | ORAL_TABLET | Freq: Every day | ORAL | 3 refills | Status: DC

## 2019-04-20 MED ORDER — AMLODIPINE BESYLATE 5 MG PO TABS: ORAL_TABLET | ORAL | 1 refills | Status: DC

## 2019-04-20 NOTE — Progress Notes (Signed)
[facility] [place], [place] [address]  Internal MEDICINE  Telephone Visit  Patient Name: Dustin Heath  [id number]  [id number]  Date of Service: 04/20/2019  I connected with the patient at 2:59pm by telephone and verified the patients identity using two identifiers.   I discussed the limitations, risks, security and privacy concerns of performing an evaluation and management service by telephone and the availability of in person appointments. I also discussed with the patient that there may be a patient responsible charge related to the service.  The patient expressed understanding and agrees to proceed.    Chief Complaint  Patient presents with  . Telephone Screen  . Telephone Assessment  . Follow-up  . Medication Refill    The patient has been contacted via telephone for follow up visit due to concerns for spread of novel coronavirus. The patient states that everything is about the same. Still has some trouble with sleep, but has been doing well on ambien at night as needed. He does need refills for this today. States that he would also like to get refills of his blood pressure medications as ninety day prescriptions. Feels as though blood pressure is stable and doing well.       Current Medication: Outpatient Encounter Medications as of 04/20/2019  Medication Sig  . amLODipine (NORVASC) 5 MG tablet TAKE 1-2 TABLETS BY MOUTH EVERY EVENING FOR BLOOD PRESSURE  . atorvastatin (LIPITOR) 10 MG tablet Take 1 tablet (10 mg total) by mouth every evening.  . busPIRone (BUSPAR) 15 MG tablet TAKE 1 TABLET (15 MG TOTAL) BY MOUTH 2 (TWO) TIMES DAILY.  . citalopram (CELEXA) 20 MG tablet TAKE 1 TABLET (20 MG TOTAL) BY MOUTH DAILY.  . fluticasone (FLONASE) 50 MCG/ACT nasal spray Place 1 spray into both nostrils 2 (two) times daily.  Marland Kitchen lisinopril-hydrochlorothiazide (ZESTORETIC) 20-25 MG tablet Take 1 tablet by mouth every morning.  . nicotine polacrilex (NICORETTE STARTER  KIT) 4 MG gum Take 1 each (4 mg total) by mouth as needed for smoking cessation.  Marland Kitchen zolpidem (AMBIEN) 10 MG tablet Take 1 tablet (10 mg total) by mouth at bedtime.  . [DISCONTINUED] amLODipine (NORVASC) 5 MG tablet TAKE 1-2 TABLETS BY MOUTH EVERY EVENING FOR BLOOD PRESSURE  . [DISCONTINUED] atorvastatin (LIPITOR) 10 MG tablet Take 1 tablet (10 mg total) by mouth every evening.  . [DISCONTINUED] lisinopril-hydrochlorothiazide (PRINZIDE,ZESTORETIC) 20-25 MG tablet Take 1 tablet by mouth every morning.  . [DISCONTINUED] zolpidem (AMBIEN) 10 MG tablet TAKE 1 TABLET (10 MG TOTAL) BY MOUTH AT BEDTIME.   No facility-administered encounter medications on file as of 04/20/2019.     Surgical History: Past Surgical History:  Procedure Laterality Date  . APPENDECTOMY    . NOSE SURGERY      Medical History: Past Medical History:  Diagnosis Date  . Hyperlipidemia   . Hypertension   . Panic disorder   . Schizophrenia ([place])     Family History: Family History  Problem Relation Age of Onset  . Hypertension Mother   . Hypertension Father     Social History   Socioeconomic History  . Marital status: Single    Spouse name: Not on file  . Number of children: Not on file  . Years of education: Not on file  . Highest education level: Not on file  Occupational History  . Not on file  Social Needs  . Financial resource strain: Not on file  . Food insecurity:    Worry: Not on file  Inability: Not on file  . Transportation needs:    Medical: Not on file    Non-medical: Not on file  Tobacco Use  . Smoking status: Current Every Day Smoker    Packs/day: 1.00    Years: 40.00    Pack years: 40.00    Types: Cigarettes  . Smokeless tobacco: Never Used  Substance and Sexual Activity  . Alcohol use: Never    Frequency: Never  . Drug use: Never  . Sexual activity: Not on file  Lifestyle  . Physical activity:    Days per week: Not on file    Minutes per session: Not on file  . Stress:  Not on file  Relationships  . Social connections:    Talks on phone: Not on file    Gets together: Not on file    Attends religious service: Not on file    Active member of club or organization: Not on file    Attends meetings of clubs or organizations: Not on file    Relationship status: Not on file  . Intimate partner violence:    Fear of current or ex partner: Not on file    Emotionally abused: Not on file    Physically abused: Not on file    Forced sexual activity: Not on file  Other Topics Concern  . Not on file  Social History Narrative  . Not on file      Review of Systems  Constitutional: Negative for chills, fatigue and unexpected weight change.  HENT: Negative for congestion, rhinorrhea, sneezing and sore throat.   Respiratory: Negative for cough, chest tightness, shortness of breath and wheezing.   Cardiovascular: Negative for chest pain and palpitations.  Gastrointestinal: Negative for abdominal pain, constipation, diarrhea, nausea and vomiting.  Endocrine: Negative for cold intolerance, heat intolerance, polydipsia and polyuria.  Musculoskeletal: Negative for arthralgias, back pain, joint swelling and neck pain.  Skin: Negative.  Negative for rash.  Neurological: Negative for dizziness, tremors, numbness and headaches.  Hematological: Negative for adenopathy. Does not bruise/bleed easily.  Psychiatric/Behavioral: Positive for dysphoric mood and sleep disturbance. Negative for behavioral problems and suicidal ideas. The patient is nervous/anxious.     Today's Vitals   04/20/19 1405  BP: 130/85  Pulse: 64  Weight: 225 lb (102.1 kg)  Height: '5\' 7"'$  (1.702 m)   Body mass index is 35.24 kg/m.  Observation/Objective:  The patient is alert, oriented, and pleasant. He does not sound to be in any acute distress at this time.    Assessment/Plan: 1. Essential (primary) hypertension Blood pressure doing well. Continue bp medication as prescribed. Sent refill  requests for 90 day prescriptions.  - amLODipine (NORVASC) 5 MG tablet; TAKE 1-2 TABLETS BY MOUTH EVERY EVENING FOR BLOOD PRESSURE  Dispense: 180 tablet; Refill: 1 - lisinopril-hydrochlorothiazide (ZESTORETIC) 20-25 MG tablet; Take 1 tablet by mouth every morning.  Dispense: 90 tablet; Refill: 3  2. Mixed hyperlipidemia Continue atorvastatin as prescribed  - atorvastatin (LIPITOR) 10 MG tablet; Take 1 tablet (10 mg total) by mouth every evening.  Dispense: 90 tablet; Refill: 3  3. Schizoaffective disorder, depressive type ([place]) Continue regular visits with psychiatry as scheduled.   4. Insomnia, unspecified type May take ambien as needed and as prescribed. Refills sent to his pharmacy today. - zolpidem (AMBIEN) 10 MG tablet; Take 1 tablet (10 mg total) by mouth at bedtime.  Dispense: 30 tablet; Refill: 3  General Counseling: Terek verbalizes understanding of the findings of today's phone visit and agrees with  plan of treatment. I have discussed any further diagnostic evaluation that may be needed or ordered today. We also reviewed his medications today. he has been encouraged to call the office with any questions or concerns that should arise related to todays visit.  This patient was seen by [place] with Dr Lavera Guise as a part of collaborative care agreement  Meds ordered this encounter  Medications  . amLODipine (NORVASC) 5 MG tablet    Sig: TAKE 1-2 TABLETS BY MOUTH EVERY EVENING FOR BLOOD PRESSURE    Dispense:  180 tablet    Refill:  1    Please fill as 90 prescription    Order Specific Question:   Supervising Provider    Answer:   Lavera Guise [place]  . atorvastatin (LIPITOR) 10 MG tablet    Sig: Take 1 tablet (10 mg total) by mouth every evening.    Dispense:  90 tablet    Refill:  3    Please fill as 90 day prescription.    Order Specific Question:   Supervising Provider    Answer:   Lavera Guise [[id number]]  . lisinopril-hydrochlorothiazide  (ZESTORETIC) 20-25 MG tablet    Sig: Take 1 tablet by mouth every morning.    Dispense:  90 tablet    Refill:  3    Please fill as 90 day prescription    Order Specific Question:   Supervising Provider    Answer:   Lavera Guise [place]  . zolpidem (AMBIEN) 10 MG tablet    Sig: Take 1 tablet (10 mg total) by mouth at bedtime.    Dispense:  30 tablet    Refill:  3    Maximum Refills Reached    Order Specific Question:   Supervising Provider    Answer:   Lavera Guise [[id number]]    Time spent: 43 Minutes    Dr Lavera Guise Internal medicine

## 2019-05-15 ENCOUNTER — Other Ambulatory Visit: Payer: Self-pay | Admitting: Nurse Practitioner

## 2019-05-15 DIAGNOSIS — F251 Schizoaffective disorder, depressive type: Secondary | ICD-10-CM

## 2019-05-15 DIAGNOSIS — F411 Generalized anxiety disorder: Secondary | ICD-10-CM

## 2019-05-15 MED ORDER — CITALOPRAM HYDROBROMIDE 20 MG PO TABS: 20.0000 mg | ORAL_TABLET | Freq: Every day | ORAL | 5 refills | Status: DC

## 2019-05-15 MED ORDER — BUSPIRONE HCL 15 MG PO TABS: 15.0000 mg | ORAL_TABLET | Freq: Two times a day (BID) | ORAL | 5 refills | Status: DC

## 2019-07-22 ENCOUNTER — Other Ambulatory Visit: Payer: Self-pay

## 2019-07-22 ENCOUNTER — Encounter: Payer: Self-pay | Admitting: Adult Health

## 2019-07-22 ENCOUNTER — Ambulatory Visit: Payer: Medicaid Other | Admitting: Adult Health

## 2019-07-22 VITALS — BP 129/81 | HR 70 | Resp 16 | Ht 67.0 in | Wt 220.0 lb

## 2019-07-22 DIAGNOSIS — G4700 Insomnia, unspecified: Secondary | ICD-10-CM

## 2019-07-22 DIAGNOSIS — E782 Mixed hyperlipidemia: Secondary | ICD-10-CM

## 2019-07-22 DIAGNOSIS — I10 Essential (primary) hypertension: Secondary | ICD-10-CM

## 2019-07-22 DIAGNOSIS — F17210 Nicotine dependence, cigarettes, uncomplicated: Secondary | ICD-10-CM

## 2019-07-22 DIAGNOSIS — F251 Schizoaffective disorder, depressive type: Secondary | ICD-10-CM | POA: Diagnosis not present

## 2019-07-22 MED ORDER — ZOLPIDEM TARTRATE 10 MG PO TABS: 10.0000 mg | ORAL_TABLET | Freq: Every day | ORAL | 3 refills | Status: DC

## 2019-07-22 NOTE — Progress Notes (Signed)
[facility] [place], [place] [address]  Internal MEDICINE  Telephone Visit  Patient Name: Dustin Heath  [id number]  [id number]  Date of Service: 07/22/2019  I connected with the patient at 1046 by telephone and verified the patients identity using two identifiers.  I discussed the limitations, risks, security and privacy concerns of performing an evaluation and management service by telephone and the availability of in person appointments. I also discussed with the patient that there may be a patient responsible charge related to the service.  The patient expressed understanding and agrees to proceed.    Chief Complaint  Patient presents with  . Medical Management of Chronic Issues    3 month follow up  . Hyperlipidemia  . Hypertension    HPI  Pt seen via telephone for follow up on HLD an HTN.  Pt reports his bp is well controlled.  He also reports he has lost 5 pounds since his last visit. He is taking his medications and denies any further issues.         Current Medication: Outpatient Encounter Medications as of 07/22/2019  Medication Sig  . amLODipine (NORVASC) 5 MG tablet TAKE 1-2 TABLETS BY MOUTH EVERY EVENING FOR BLOOD PRESSURE  . atorvastatin (LIPITOR) 10 MG tablet Take 1 tablet (10 mg total) by mouth every evening.  . busPIRone (BUSPAR) 15 MG tablet Take 1 tablet (15 mg total) by mouth 2 (two) times daily.  . citalopram (CELEXA) 20 MG tablet Take 1 tablet (20 mg total) by mouth daily.  . fluticasone (FLONASE) 50 MCG/ACT nasal spray Place 1 spray into both nostrils 2 (two) times daily.  Marland Kitchen lisinopril-hydrochlorothiazide (ZESTORETIC) 20-25 MG tablet Take 1 tablet by mouth every morning.  . zolpidem (AMBIEN) 10 MG tablet Take 1 tablet (10 mg total) by mouth at bedtime.  . [DISCONTINUED] zolpidem (AMBIEN) 10 MG tablet Take 1 tablet (10 mg total) by mouth at bedtime.  . [DISCONTINUED] nicotine polacrilex (NICORETTE STARTER KIT) 4 MG gum Take 1 each (4 mg  total) by mouth as needed for smoking cessation. (Patient not taking: Reported on 07/22/2019)   No facility-administered encounter medications on file as of 07/22/2019.     Surgical History: Past Surgical History:  Procedure Laterality Date  . APPENDECTOMY    . NOSE SURGERY      Medical History: Past Medical History:  Diagnosis Date  . Hyperlipidemia   . Hypertension   . Panic disorder   . Schizophrenia ([place])     Family History: Family History  Problem Relation Age of Onset  . Hypertension Mother   . Hypertension Father     Social History   Socioeconomic History  . Marital status: Single    Spouse name: Not on file  . Number of children: Not on file  . Years of education: Not on file  . Highest education level: Not on file  Occupational History  . Not on file  Social Needs  . Financial resource strain: Not on file  . Food insecurity    Worry: Not on file    Inability: Not on file  . Transportation needs    Medical: Not on file    Non-medical: Not on file  Tobacco Use  . Smoking status: Current Every Day Smoker    Packs/day: 1.00    Years: 40.00    Pack years: 40.00    Types: Cigarettes  . Smokeless tobacco: Never Used  Substance and Sexual Activity  . Alcohol use: Never  Frequency: Never  . Drug use: Never  . Sexual activity: Not on file  Lifestyle  . Physical activity    Days per week: Not on file    Minutes per session: Not on file  . Stress: Not on file  Relationships  . Social connections    Talks on phone: Not on file    Gets together: Not on file    Attends religious service: Not on file    Active member of club or organization: Not on file    Attends meetings of clubs or organizations: Not on file    Relationship status: Not on file  . Intimate partner violence    Fear of current or ex partner: Not on file    Emotionally abused: Not on file    Physically abused: Not on file    Forced sexual activity: Not on file  Other Topics Concern   . Not on file  Social History Narrative  . Not on file      Review of Systems  Constitutional: Negative.  Negative for chills, fatigue and unexpected weight change.  HENT: Negative.  Negative for congestion, rhinorrhea, sneezing and sore throat.   Eyes: Negative for redness.  Respiratory: Negative.  Negative for cough, chest tightness and shortness of breath.   Cardiovascular: Negative.  Negative for chest pain and palpitations.  Gastrointestinal: Negative.  Negative for abdominal pain, constipation, diarrhea, nausea and vomiting.  Endocrine: Negative.   Genitourinary: Negative.  Negative for dysuria and frequency.  Musculoskeletal: Negative.  Negative for arthralgias, back pain, joint swelling and neck pain.  Skin: Negative.  Negative for rash.  Allergic/Immunologic: Negative.   Neurological: Negative.  Negative for tremors and numbness.  Hematological: Negative for adenopathy. Does not bruise/bleed easily.  Psychiatric/Behavioral: Negative.  Negative for behavioral problems, sleep disturbance and suicidal ideas. The patient is not nervous/anxious.     Vital Signs: BP 129/81   Pulse 70   Resp 16   Ht 5' 7" (1.702 m)   Wt 220 lb (99.8 kg)   BMI 34.46 kg/m    Observation/Objective:  NAD noted at this time.    Assessment/Plan: 1. Essential (primary) hypertension Stable, continue current medications.   2. Mixed hyperlipidemia Check lipid panel at next blood draw  3. Schizoaffective disorder, depressive type (HCC) Stable, pt is doing well and taking his medications without fail.  4. Insomnia, unspecified type Reviewed risks and possible side effects associated with taking opiates, benzodiazepines and other CNS depressants. Combination of these could cause dizziness and drowsiness. Advised patient not to drive or operate machinery when taking these medications, as patient's and other's life can be at risk and will have consequences. Patient verbalized understanding in  this matter. Dependence and abuse for these drugs will be monitored closely. A Controlled substance policy and procedure is on file which allows [facility] to order a urine drug screen test at any visit. Patient understands and agrees with the plan - zolpidem (AMBIEN) 10 MG tablet; Take 1 tablet (10 mg total) by mouth at bedtime.  Dispense: 30 tablet; Refill: 3  5. Nicotine dependence, cigarettes, uncomplicated Smoking cessation counseling: 1. Pt acknowledges the risks of long term smoking, she will try to quite smoking. 2. Options for different medications including nicotine products, chewing gum, patch etc, Wellbutrin and Chantix is discussed 3. Goal and date of compete cessation is discussed 4. Total time spent in smoking cessation is 15 min.    General Counseling: Raybon verbalizes understanding of the findings of   today's phone visit and agrees with plan of treatment. I have discussed any further diagnostic evaluation that may be needed or ordered today. We also reviewed his medications today. he has been encouraged to call the office with any questions or concerns that should arise related to todays visit.    No orders of the defined types were placed in this encounter.   Meds ordered this encounter  Medications  . zolpidem (AMBIEN) 10 MG tablet    Sig: Take 1 tablet (10 mg total) by mouth at bedtime.    Dispense:  30 tablet    Refill:  3    Maximum Refills Reached    Time spent: [place] AGNP-C Internal medicine

## 2019-10-21 ENCOUNTER — Ambulatory Visit: Payer: Medicaid Other | Admitting: Adult Health

## 2019-10-22 ENCOUNTER — Ambulatory Visit: Payer: Medicaid Other | Admitting: Nurse Practitioner

## 2019-10-22 ENCOUNTER — Other Ambulatory Visit: Payer: Self-pay

## 2019-10-22 ENCOUNTER — Encounter: Payer: Self-pay | Admitting: Nurse Practitioner

## 2019-10-22 VITALS — BP 150/84 | HR 78 | Ht 67.0 in | Wt 220.0 lb

## 2019-10-22 DIAGNOSIS — M545 Low back pain, unspecified: Secondary | ICD-10-CM

## 2019-10-22 DIAGNOSIS — F411 Generalized anxiety disorder: Secondary | ICD-10-CM

## 2019-10-22 DIAGNOSIS — I10 Essential (primary) hypertension: Secondary | ICD-10-CM

## 2019-10-22 MED ORDER — CYCLOBENZAPRINE HCL 5 MG PO TABS: 5.0000 mg | ORAL_TABLET | Freq: Every evening | ORAL | 1 refills | Status: DC | PRN

## 2019-10-22 MED ORDER — LISINOPRIL-HYDROCHLOROTHIAZIDE 20-25 MG PO TABS: 1.5000 | ORAL_TABLET | ORAL | 1 refills | Status: DC

## 2019-10-22 NOTE — Progress Notes (Signed)
[facility] [place], [place] [address]  Internal MEDICINE  Telephone Visit  Patient Name: Dustin Heath  [id number]  [id number]  Date of Service: 10/22/2019  I connected with the patient at 3:55pm by telephone and verified the patients identity using two identifiers.   I discussed the limitations, risks, security and privacy concerns of performing an evaluation and management service by telephone and the availability of in person appointments. I also discussed with the patient that there may be a patient responsible charge related to the service.  The patient expressed understanding and agrees to proceed.    Chief Complaint  Patient presents with  . Telephone Assessment  . Telephone Screen  . Hypertension  . Medical Management of Chronic Issues    Bp is high cannot sleep last 3 nights because pt had new mattress   . Hyperlipidemia    The patient has been contacted via telephone for follow up visit due to concerns for spread of novel coronavirus. The patient states that he his feeling poorly today. Has been having trouble sleeping on new mattress. He is feeling tired and nervous. Blood pressure has been elevated recently. Top number is generally running between 150 and 160. Bottom number generally around 90. He states that since getting his new mattress, he is having severe lower back pain. Feels like it is in spasms at all times. That is also making it difficult to sleep and likely running up his blood pressure.       Current Medication: Outpatient Encounter Medications as of 10/22/2019  Medication Sig  . amLODipine (NORVASC) 5 MG tablet TAKE 1-2 TABLETS BY MOUTH EVERY EVENING FOR BLOOD PRESSURE  . atorvastatin (LIPITOR) 10 MG tablet Take 1 tablet (10 mg total) by mouth every evening.  . busPIRone (BUSPAR) 15 MG tablet Take 1 tablet (15 mg total) by mouth 2 (two) times daily.  . citalopram (CELEXA) 20 MG tablet Take 1 tablet (20 mg total) by mouth daily.  .  fluticasone (FLONASE) 50 MCG/ACT nasal spray Place 1 spray into both nostrils 2 (two) times daily.  Marland Kitchen lisinopril-hydrochlorothiazide (ZESTORETIC) 20-25 MG tablet Take 1.5 tablets by mouth every morning.  . zolpidem (AMBIEN) 10 MG tablet Take 1 tablet (10 mg total) by mouth at bedtime.  . [DISCONTINUED] lisinopril-hydrochlorothiazide (ZESTORETIC) 20-25 MG tablet Take 1 tablet by mouth every morning.  . cyclobenzaprine (FLEXERIL) 5 MG tablet Take 1 tablet (5 mg total) by mouth at bedtime as needed for muscle spasms.   No facility-administered encounter medications on file as of 10/22/2019.     Surgical History: Past Surgical History:  Procedure Laterality Date  . APPENDECTOMY    . NOSE SURGERY      Medical History: Past Medical History:  Diagnosis Date  . Hyperlipidemia   . Hypertension   . Panic disorder   . Schizophrenia ([place])     Family History: Family History  Problem Relation Age of Onset  . Hypertension Mother   . Hypertension Father     Social History   Socioeconomic History  . Marital status: Single    Spouse name: Not on file  . Number of children: Not on file  . Years of education: Not on file  . Highest education level: Not on file  Occupational History  . Not on file  Social Needs  . Financial resource strain: Not on file  . Food insecurity    Worry: Not on file    Inability: Not on file  . Transportation needs  Medical: Not on file    Non-medical: Not on file  Tobacco Use  . Smoking status: Current Every Day Smoker    Packs/day: 1.00    Years: 40.00    Pack years: 40.00    Types: Cigarettes  . Smokeless tobacco: Never Used  Substance and Sexual Activity  . Alcohol use: Never    Frequency: Never  . Drug use: Never  . Sexual activity: Not on file  Lifestyle  . Physical activity    Days per week: Not on file    Minutes per session: Not on file  . Stress: Not on file  Relationships  . Social Herbalist on phone: Not on file     Gets together: Not on file    Attends religious service: Not on file    Active member of club or organization: Not on file    Attends meetings of clubs or organizations: Not on file    Relationship status: Not on file  . Intimate partner violence    Fear of current or ex partner: Not on file    Emotionally abused: Not on file    Physically abused: Not on file    Forced sexual activity: Not on file  Other Topics Concern  . Not on file  Social History Narrative  . Not on file      Review of Systems  Constitutional: Positive for fatigue. Negative for chills and unexpected weight change.  HENT: Negative for congestion, rhinorrhea, sneezing and sore throat.   Respiratory: Negative for cough, chest tightness, shortness of breath and wheezing.   Cardiovascular: Negative for chest pain and palpitations.       Elevated blood pressure   Gastrointestinal: Negative for abdominal pain, constipation, diarrhea, nausea and vomiting.  Musculoskeletal: Positive for back pain and myalgias. Negative for arthralgias, joint swelling and neck pain.  Skin: Negative.  Negative for rash.  Neurological: Negative for tremors, numbness and headaches.  Hematological: Negative for adenopathy. Does not bruise/bleed easily.  Psychiatric/Behavioral: Positive for dysphoric mood. Negative for behavioral problems, sleep disturbance and suicidal ideas. The patient is nervous/anxious.     Today's Vitals   10/22/19 1510  BP: (!) 150/84  Pulse: 78  Weight: 220 lb (99.8 kg)  Height: 5\' 7"  (1.702 m)   Body mass index is 34.46 kg/m.   Observation/Objective:  The patient is alert and oriented. He is pleasant and answering all questions appropriately. Breathing is non-labored. He is in no acute distress. The patient sounds as though he does not feel well.    Assessment/Plan: 1. Essential (primary) hypertension Increase lisinopril/HCTZ to 1.5 tablets daily. Continue to take amloipine 10mg  every day.  -  lisinopril-hydrochlorothiazide (ZESTORETIC) 20-25 MG tablet; Take 1.5 tablets by mouth every morning.  Dispense: 135 tablet; Refill: 1  2. Dorsalgia of lumbosacral region Add flexeril 5mg  every evening to help with low back pain. Suggest heating pad to help reduce pain and inflammation.  - cyclobenzaprine (FLEXERIL) 5 MG tablet; Take 1 tablet (5 mg total) by mouth at bedtime as needed for muscle spasms.  Dispense: 30 tablet; Refill: 1  3. Generalized anxiety disorder No changes today. Will discuss further at next routine visit.    General Counseling: Demarious verbalizes understanding of the findings of today's phone visit and agrees with plan of treatment. I have discussed any further diagnostic evaluation that may be needed or ordered today. We also reviewed his medications today. he has been encouraged to call the office with any questions  or concerns that should arise related to todays visit.  Hypertension Counseling:   The following hypertensive lifestyle modification were recommended and discussed:  1. Limiting alcohol intake to less than 1 oz/day of ethanol:(24 oz of beer or 8 oz of wine or 2 oz of 100-proof whiskey). 2. Take baby ASA 81 mg daily. 3. Importance of regular aerobic exercise and losing weight. 4. Reduce dietary saturated fat and cholesterol intake for overall cardiovascular health. 5. Maintaining adequate dietary potassium, calcium, and magnesium intake. 6. Regular monitoring of the blood pressure. 7. Reduce sodium intake to less than 100 mmol/day (less than 2.3 gm of sodium or less than 6 gm of sodium choride)   This patient was seen by [facility] with Dr Lavera Guise as a part of collaborative care agreement  Meds ordered this encounter  Medications  . cyclobenzaprine (FLEXERIL) 5 MG tablet    Sig: Take 1 tablet (5 mg total) by mouth at bedtime as needed for muscle spasms.    Dispense:  30 tablet    Refill:  1    Order Specific Question:    Supervising Provider    Answer:   Lavera Guise [id number]  . lisinopril-hydrochlorothiazide (ZESTORETIC) 20-25 MG tablet    Sig: Take 1.5 tablets by mouth every morning.    Dispense:  135 tablet    Refill:  1    Increased dosing for this medication.    Order Specific Question:   Supervising Provider    Answer:   Lavera Guise [id number]    Time spent: 51 Minutes    Dr Lavera Guise Internal medicine

## 2019-11-02 ENCOUNTER — Encounter: Payer: Self-pay | Admitting: Internal Medicine

## 2019-11-02 ENCOUNTER — Ambulatory Visit: Payer: Medicaid Other | Admitting: Internal Medicine

## 2019-11-02 ENCOUNTER — Other Ambulatory Visit: Payer: Self-pay

## 2019-11-02 DIAGNOSIS — F323 Major depressive disorder, single episode, severe with psychotic features: Secondary | ICD-10-CM

## 2019-11-02 DIAGNOSIS — G4700 Insomnia, unspecified: Secondary | ICD-10-CM

## 2019-11-02 DIAGNOSIS — I10 Essential (primary) hypertension: Secondary | ICD-10-CM

## 2019-11-02 DIAGNOSIS — F411 Generalized anxiety disorder: Secondary | ICD-10-CM | POA: Diagnosis not present

## 2019-11-02 DIAGNOSIS — G4733 Obstructive sleep apnea (adult) (pediatric): Secondary | ICD-10-CM

## 2019-11-02 MED ORDER — ALPRAZOLAM 0.25 MG PO TABS: ORAL_TABLET | ORAL | 0 refills | Status: DC

## 2019-11-02 MED ORDER — CITALOPRAM HYDROBROMIDE 40 MG PO TABS: ORAL_TABLET | ORAL | 3 refills | Status: DC

## 2019-11-02 NOTE — Progress Notes (Signed)
[facility] [place], [place] [address]  Internal MEDICINE  Office Visit Note  Patient Name: Dustin Heath  [id number]  [id number]  Date of Service: 11/07/2019  Chief Complaint  Patient presents with  . Hyperlipidemia  . Hypertension  . Medication Refill    zolpidem,lisinopril-hctz,amlodopine and citalopram    HPI Pt is here for routine follow up. He is still not sleeping well, He feels anxious, Buspar is not helping either. Pt was hospitalized in 2014 when had a emotional breakdown after losing his job and money. He made suicidal threats in public. However no dx of schizophrenia was made. He had not had a sleep study or other psychiatric evaluation since then. BP has been elevated periodically. Has been on increased dose of Norvasc  No chest pain or sob, denies any suicidal ideation   Current Medication: Outpatient Encounter Medications as of 11/02/2019  Medication Sig  . amLODipine (NORVASC) 5 MG tablet TAKE 1-2 TABLETS BY MOUTH EVERY EVENING FOR BLOOD PRESSURE  . atorvastatin (LIPITOR) 10 MG tablet Take 1 tablet (10 mg total) by mouth every evening.  . citalopram (CELEXA) 40 MG tablet Take one tab po qd for generalized anxiety disorder  . cyclobenzaprine (FLEXERIL) 5 MG tablet Take 1 tablet (5 mg total) by mouth at bedtime as needed for muscle spasms.  Marland Kitchen lisinopril-hydrochlorothiazide (ZESTORETIC) 20-25 MG tablet Take 1.5 tablets by mouth every morning.  . zolpidem (AMBIEN) 10 MG tablet Take 1 tablet (10 mg total) by mouth at bedtime.  . [DISCONTINUED] busPIRone (BUSPAR) 15 MG tablet Take 1 tablet (15 mg total) by mouth 2 (two) times daily.  . [DISCONTINUED] citalopram (CELEXA) 20 MG tablet Take 1 tablet (20 mg total) by mouth daily.  . [DISCONTINUED] fluticasone (FLONASE) 50 MCG/ACT nasal spray Place 1 spray into both nostrils 2 (two) times daily.  Marland Kitchen ALPRAZolam (XANAX) 0.25 MG tablet Take one tab po qd prn for anxiety   No facility-administered encounter  medications on file as of 11/02/2019.     Surgical History: Past Surgical History:  Procedure Laterality Date  . APPENDECTOMY    . NOSE SURGERY      Medical History: Past Medical History:  Diagnosis Date  . Hyperlipidemia   . Hypertension   . Panic disorder     Family History: Family History  Problem Relation Age of Onset  . Hypertension Mother   . Hypertension Father     Social History   Socioeconomic History  . Marital status: Single    Spouse name: Not on file  . Number of children: Not on file  . Years of education: Not on file  . Highest education level: Not on file  Occupational History  . Not on file  Social Needs  . Financial resource strain: Not on file  . Food insecurity    Worry: Not on file    Inability: Not on file  . Transportation needs    Medical: Not on file    Non-medical: Not on file  Tobacco Use  . Smoking status: Current Every Day Smoker    Packs/day: 1.00    Years: 40.00    Pack years: 40.00    Types: Cigarettes  . Smokeless tobacco: Never Used  Substance and Sexual Activity  . Alcohol use: Never    Frequency: Never  . Drug use: Never  . Sexual activity: Not on file  Lifestyle  . Physical activity    Days per week: Not on file    Minutes per session:  Not on file  . Stress: Not on file  Relationships  . Social Herbalist on phone: Not on file    Gets together: Not on file    Attends religious service: Not on file    Active member of club or organization: Not on file    Attends meetings of clubs or organizations: Not on file    Relationship status: Not on file  . Intimate partner violence    Fear of current or ex partner: Not on file    Emotionally abused: Not on file    Physically abused: Not on file    Forced sexual activity: Not on file  Other Topics Concern  . Not on file  Social History Narrative  . Not on file    Review of Systems  Constitutional: Negative for chills, fatigue and unexpected weight  change.  HENT: Negative for congestion, postnasal drip, rhinorrhea, sneezing and sore throat.   Eyes: Negative for redness.  Respiratory: Negative for cough, chest tightness and shortness of breath.   Cardiovascular: Negative for chest pain and palpitations.  Gastrointestinal: Negative for abdominal pain, constipation, diarrhea, nausea and vomiting.  Genitourinary: Negative for dysuria and frequency.  Musculoskeletal: Negative for arthralgias, back pain, joint swelling and neck pain.  Skin: Negative for rash.  Neurological: Negative.  Negative for tremors and numbness.  Hematological: Negative for adenopathy. Does not bruise/bleed easily.  Psychiatric/Behavioral: Positive for sleep disturbance. Negative for behavioral problems (Depression). The patient is nervous/anxious.     Vital Signs: BP 124/80   Pulse 100   Temp (!) 97.5 F (36.4 C)   Resp 16   Ht 5\' 7"  (1.702 m)   Wt 220 lb (99.8 kg)   SpO2 98%   BMI 34.46 kg/m    Physical Exam Constitutional:      Appearance: Normal appearance. He is obese.  HENT:     Head: Normocephalic and atraumatic.  Cardiovascular:     Rate and Rhythm: Normal rate and regular rhythm.     Pulses: Normal pulses.     Heart sounds: Normal heart sounds.  Pulmonary:     Effort: Pulmonary effort is normal. No respiratory distress.     Breath sounds: Normal breath sounds.  Neurological:     General: No focal deficit present.     Mental Status: He is alert and oriented to person, place, and time.     Motor: No weakness.  Psychiatric:        Mood and Affect: Mood normal.        Behavior: Behavior normal.    Assessment/Plan: 1. Essential (primary) hypertension - Continue Norvasc 10 mg and Zestoretic 20/25 mg po qd, will change Norvasc to 10 mg qd on next visit.  2. 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 Insomnia, unspecified type - Continue Ambien for now, might add Remeron or Depakote at bedtime  - Ferritin; Future - Ceruloplasmin - Ferritin  4. Generalized anxiety disorder - Pt might have to see Psych again if needed  - ALPRAZolam (XANAX) 0.25 MG tablet; Take one tab po qd prn for anxiety  Dispense: 30 tablet; Refill: 0 - Ferritin  5. Obstructive sleep apnea hypopnea, moderate - He will need sleep study  General Counseling: [place] verbalizes understanding of the findings of todays visit and agrees with plan of treatment. I have discussed any further diagnostic evaluation that may be needed or ordered today. We also reviewed his medications today. he has been encouraged to call the office with any questions or concerns that should arise related to todays visit.  Will look into Colonoscopy records and other diagnostics for preventive health maintenance   Orders Placed This Encounter  Procedures  . Ferritin  . Ceruloplasmin    Meds ordered this encounter  Medications  . ALPRAZolam (XANAX) 0.25 MG tablet    Sig: Take one tab po qd prn for anxiety    Dispense:  30 tablet    Refill:  0  . citalopram (CELEXA) 40 MG tablet    Sig: Take one tab po qd for generalized anxiety disorder    Dispense:  90 tablet    Refill:  3    Maximum Refills Reached    Time spent:25 Minutes  Dr Lavera Guise Internal medicine

## 2019-11-07 ENCOUNTER — Encounter: Payer: Self-pay | Admitting: Internal Medicine

## 2019-11-11 ENCOUNTER — Encounter: Payer: Self-pay | Admitting: Internal Medicine

## 2019-11-23 ENCOUNTER — Telehealth: Payer: Self-pay

## 2019-11-23 NOTE — Telephone Encounter (Signed)
Called [facility]mom informing patient of appointment. klh 

## 2019-11-23 NOTE — Telephone Encounter (Signed)
Confirmed patient appt for 11/25/19. Dustin Heath

## 2019-11-25 ENCOUNTER — Ambulatory Visit: Payer: Medicaid Other | Admitting: Adult Health

## 2019-11-25 ENCOUNTER — Other Ambulatory Visit: Payer: Self-pay

## 2019-11-25 ENCOUNTER — Encounter: Payer: Self-pay | Admitting: Adult Health

## 2019-11-25 VITALS — BP 140/86 | HR 104 | Resp 16 | Ht 67.0 in | Wt 220.0 lb

## 2019-11-25 DIAGNOSIS — F323 Major depressive disorder, single episode, severe with psychotic features: Secondary | ICD-10-CM | POA: Diagnosis not present

## 2019-11-25 DIAGNOSIS — Z79899 Other long term (current) drug therapy: Secondary | ICD-10-CM

## 2019-11-25 DIAGNOSIS — F411 Generalized anxiety disorder: Secondary | ICD-10-CM

## 2019-11-25 DIAGNOSIS — G4700 Insomnia, unspecified: Secondary | ICD-10-CM

## 2019-11-25 DIAGNOSIS — I10 Essential (primary) hypertension: Secondary | ICD-10-CM | POA: Diagnosis not present

## 2019-11-25 DIAGNOSIS — F17210 Nicotine dependence, cigarettes, uncomplicated: Secondary | ICD-10-CM

## 2019-11-25 LAB — POCT URINE DRUG SCREEN
POC Amphetamine UR: NOT DETECTED
POC BENZODIAZEPINES UR: POSITIVE — AB
POC Barbiturate UR: NOT DETECTED
POC Cocaine UR: NOT DETECTED
POC Ecstasy UR: NOT DETECTED
POC Marijuana UR: NOT DETECTED
POC Methadone UR: NOT DETECTED
POC Methamphetamine UR: NOT DETECTED
POC Opiate Ur: NOT DETECTED
POC Oxycodone UR: NOT DETECTED
POC PHENCYCLIDINE UR: NOT DETECTED
POC TRICYCLICS UR: NOT DETECTED

## 2019-11-25 MED ORDER — ALPRAZOLAM 0.25 MG PO TABS: ORAL_TABLET | ORAL | 0 refills | Status: DC

## 2019-11-25 MED ORDER — ZOLPIDEM TARTRATE 10 MG PO TABS: 10.0000 mg | ORAL_TABLET | Freq: Every day | ORAL | 2 refills | Status: DC

## 2019-11-25 NOTE — Progress Notes (Signed)
[facility] [place], [place] [address]  Internal MEDICINE  Office Visit Note  Patient Name: Dustin Heath  [id number]  [id number]  Date of Service: 11/25/2019  Chief Complaint  Patient presents with  . Hypertension    HPI  Pt is here for follow up. He was having multiple issues at last visit.  He reports he is doing much better at this time.   On arrival his blood pressure was elevated.  At recheck his bp was 140/86.  Denies Chest pain, Shortness of breath, palpitations, headache, or blurred vision.      Current Medication: Outpatient Encounter Medications as of 11/25/2019  Medication Sig  . ALPRAZolam (XANAX) 0.25 MG tablet Take one tab po qd prn for anxiety  . amLODipine (NORVASC) 5 MG tablet TAKE 1-2 TABLETS BY MOUTH EVERY EVENING FOR BLOOD PRESSURE  . atorvastatin (LIPITOR) 10 MG tablet Take 1 tablet (10 mg total) by mouth every evening.  . citalopram (CELEXA) 40 MG tablet Take one tab po qd for generalized anxiety disorder  . cyclobenzaprine (FLEXERIL) 5 MG tablet Take 1 tablet (5 mg total) by mouth at bedtime as needed for muscle spasms.  Marland Kitchen lisinopril-hydrochlorothiazide (ZESTORETIC) 20-25 MG tablet Take 1.5 tablets by mouth every morning.  . zolpidem (AMBIEN) 10 MG tablet Take 1 tablet (10 mg total) by mouth at bedtime.   No facility-administered encounter medications on file as of 11/25/2019.     Surgical History: Past Surgical History:  Procedure Laterality Date  . APPENDECTOMY    . NOSE SURGERY      Medical History: Past Medical History:  Diagnosis Date  . Hyperlipidemia   . Hypertension   . Panic disorder     Family History: Family History  Problem Relation Age of Onset  . Hypertension Mother   . Hypertension Father     Social History   Socioeconomic History  . Marital status: Single    Spouse name: Not on file  . Number of children: Not on file  . Years of education: Not on file  . Highest education level: Not on file   Occupational History  . Not on file  Social Needs  . Financial resource strain: Not on file  . Food insecurity    Worry: Not on file    Inability: Not on file  . Transportation needs    Medical: Not on file    Non-medical: Not on file  Tobacco Use  . Smoking status: Current Every Day Smoker    Packs/day: 1.00    Years: 40.00    Pack years: 40.00    Types: Cigarettes  . Smokeless tobacco: Never Used  Substance and Sexual Activity  . Alcohol use: Never    Frequency: Never  . Drug use: Never  . Sexual activity: Not on file  Lifestyle  . Physical activity    Days per week: Not on file    Minutes per session: Not on file  . Stress: Not on file  Relationships  . Social Herbalist on phone: Not on file    Gets together: Not on file    Attends religious service: Not on file    Active member of club or organization: Not on file    Attends meetings of clubs or organizations: Not on file    Relationship status: Not on file  . Intimate partner violence    Fear of current or ex partner: Not on file    Emotionally abused: Not on  file    Physically abused: Not on file    Forced sexual activity: Not on file  Other Topics Concern  . Not on file  Social History Narrative  . Not on file      Review of Systems  Constitutional: Negative.  Negative for chills, fatigue and unexpected weight change.  HENT: Negative.  Negative for congestion, rhinorrhea, sneezing and sore throat.   Eyes: Negative for redness.  Respiratory: Negative.  Negative for cough, chest tightness and shortness of breath.   Cardiovascular: Negative.  Negative for chest pain and palpitations.  Gastrointestinal: Negative.  Negative for abdominal pain, constipation, diarrhea, nausea and vomiting.  Endocrine: Negative.   Genitourinary: Negative.  Negative for dysuria and frequency.  Musculoskeletal: Negative.  Negative for arthralgias, back pain, joint swelling and neck pain.  Skin: Negative.  Negative  for rash.  Allergic/Immunologic: Negative.   Neurological: Negative.  Negative for tremors and numbness.  Hematological: Negative for adenopathy. Does not bruise/bleed easily.  Psychiatric/Behavioral: Negative.  Negative for behavioral problems, sleep disturbance and suicidal ideas. The patient is not nervous/anxious.     Vital Signs: BP (!) 141/95   Pulse (!) 104   Resp 16   Ht 5\' 7"  (1.702 m)   Wt 220 lb (99.8 kg)   SpO2 96%   BMI 34.46 kg/m    Physical Exam Vitals and nursing note reviewed.  Constitutional:      General: He is not in acute distress.    Appearance: He is well-developed. He is not diaphoretic.  HENT:     Head: Normocephalic and atraumatic.     Mouth/Throat:     Pharynx: No oropharyngeal exudate.  Eyes:     Pupils: Pupils are equal, round, and reactive to light.  Neck:     Thyroid: No thyromegaly.     Vascular: No JVD.     Trachea: No tracheal deviation.  Cardiovascular:     Rate and Rhythm: Normal rate and regular rhythm.     Heart sounds: Normal heart sounds. No murmur. No friction rub. No gallop.   Pulmonary:     Effort: Pulmonary effort is normal. No respiratory distress.     Breath sounds: Normal breath sounds. No wheezing or rales.  Chest:     Chest wall: No tenderness.  Abdominal:     Palpations: Abdomen is soft.  Musculoskeletal:        General: Normal range of motion.     Cervical back: Normal range of motion and neck supple.  Lymphadenopathy:     Cervical: No cervical adenopathy.  Skin:    General: Skin is warm and dry.  Neurological:     Mental Status: He is alert and oriented to person, place, and time.     Cranial Nerves: No cranial nerve deficit.  Psychiatric:        Behavior: Behavior normal.        Thought Content: Thought content normal.        Judgment: Judgment normal.     Assessment/Plan: Take amlodipine 2 tabs nightly,  1.5 lsiinoprils. 1. Essential (primary) hypertension Increase Amlodipine to 10mg  po daily.  Also  increase to 1.5 lisinopril tablets.    2. Major depression with psychotic features ([place]) Controlled, continue celexa at this time.   3. Insomnia, unspecified type Reviewed risks and possible side effects associated with taking opiates, benzodiazepines and other CNS depressants. Combination of these could cause dizziness and drowsiness. Advised patient not to drive or operate machinery when taking these medications,  as patient's and other's life can be at risk and will have consequences. Patient verbalized understanding in this matter. Dependence and abuse for these drugs will be monitored closely. A Controlled substance policy and procedure is on file which allows [facility] to order a urine drug screen test at any visit. Patient understands and agrees with the plan - zolpidem (AMBIEN) 10 MG tablet; Take 1 tablet (10 mg total) by mouth at bedtime.  Dispense: 30 tablet; Refill: 2  4. Generalized anxiety disorder Refilled Xanax at this time.   - POCT Urine Drug Screen - ALPRAZolam (XANAX) 0.25 MG tablet; Take one tab po qd prn for anxiety  Dispense: 30 tablet; Refill: 0  5. Encounter for long-term (current) use of medications - POCT Urine Drug Screen  6. Nicotine dependence, cigarettes, uncomplicated Smoking cessation counseling: 1. Pt acknowledges the risks of long term smoking, she will try to quite smoking. 2. Options for different medications including nicotine products, chewing gum, patch etc, Wellbutrin and Chantix is discussed 3. Goal and date of compete cessation is discussed 4. Total time spent in smoking cessation is 15 min.  General Counseling: Jaqua verbalizes understanding of the findings of todays visit and agrees with plan of treatment. I have discussed any further diagnostic evaluation that may be needed or ordered today. We also reviewed his medications today. he has been encouraged to call the office with any questions or concerns that should arise related to  todays visit.    Orders Placed This Encounter  Procedures  . POCT Urine Drug Screen    No orders of the defined types were placed in this encounter.   Time spent: 25 Minutes   This patient was seen by Orson Gear AGNP-C in Collaboration with Dr Lavera Guise as a part of collaborative care agreement     Kendell Bane AGNP-C Internal medicine

## 2019-12-28 ENCOUNTER — Other Ambulatory Visit: Payer: Self-pay | Admitting: Adult Health

## 2019-12-28 DIAGNOSIS — F411 Generalized anxiety disorder: Secondary | ICD-10-CM

## 2019-12-28 DIAGNOSIS — I10 Essential (primary) hypertension: Secondary | ICD-10-CM

## 2019-12-28 NOTE — Telephone Encounter (Signed)
Last 11/20 next 1/21

## 2020-01-06 ENCOUNTER — Encounter: Payer: Medicaid Other | Admitting: Adult Health

## 2020-01-26 ENCOUNTER — Other Ambulatory Visit: Payer: Self-pay | Admitting: Adult Health

## 2020-01-26 DIAGNOSIS — F411 Generalized anxiety disorder: Secondary | ICD-10-CM

## 2020-01-26 NOTE — Telephone Encounter (Signed)
Pt needs refill

## 2020-02-22 ENCOUNTER — Other Ambulatory Visit: Payer: Self-pay | Admitting: Adult Health

## 2020-02-22 DIAGNOSIS — G4700 Insomnia, unspecified: Secondary | ICD-10-CM

## 2020-02-23 ENCOUNTER — Telehealth: Payer: Self-pay

## 2020-02-23 NOTE — Telephone Encounter (Signed)
Called lmom informing patient of appointment on 02/25/2020. klh

## 2020-02-24 ENCOUNTER — Encounter: Payer: Medicaid Other | Admitting: Adult Health

## 2020-02-24 NOTE — Telephone Encounter (Signed)
Pt had  appt tomorrow  

## 2020-02-25 ENCOUNTER — Encounter: Payer: Self-pay | Admitting: Adult Health

## 2020-02-25 ENCOUNTER — Other Ambulatory Visit: Payer: Self-pay

## 2020-02-25 ENCOUNTER — Ambulatory Visit (INDEPENDENT_AMBULATORY_CARE_PROVIDER_SITE_OTHER): Payer: Medicaid Other | Admitting: Adult Health

## 2020-02-25 VITALS — BP 124/80 | HR 90 | Temp 97.8°F | Resp 16 | Ht 67.0 in | Wt 222.0 lb

## 2020-02-25 DIAGNOSIS — F411 Generalized anxiety disorder: Secondary | ICD-10-CM

## 2020-02-25 DIAGNOSIS — R300 Dysuria: Secondary | ICD-10-CM

## 2020-02-25 DIAGNOSIS — Z125 Encounter for screening for malignant neoplasm of prostate: Secondary | ICD-10-CM

## 2020-02-25 DIAGNOSIS — I10 Essential (primary) hypertension: Secondary | ICD-10-CM | POA: Diagnosis not present

## 2020-02-25 DIAGNOSIS — Z0001 Encounter for general adult medical examination with abnormal findings: Secondary | ICD-10-CM

## 2020-02-25 DIAGNOSIS — G4733 Obstructive sleep apnea (adult) (pediatric): Secondary | ICD-10-CM

## 2020-02-25 DIAGNOSIS — F323 Major depressive disorder, single episode, severe with psychotic features: Secondary | ICD-10-CM | POA: Diagnosis not present

## 2020-02-25 DIAGNOSIS — E039 Hypothyroidism, unspecified: Secondary | ICD-10-CM

## 2020-02-25 DIAGNOSIS — F17210 Nicotine dependence, cigarettes, uncomplicated: Secondary | ICD-10-CM

## 2020-02-25 DIAGNOSIS — R5383 Other fatigue: Secondary | ICD-10-CM

## 2020-02-25 DIAGNOSIS — R946 Abnormal results of thyroid function studies: Secondary | ICD-10-CM

## 2020-02-25 DIAGNOSIS — G4700 Insomnia, unspecified: Secondary | ICD-10-CM

## 2020-02-25 DIAGNOSIS — F251 Schizoaffective disorder, depressive type: Secondary | ICD-10-CM

## 2020-02-25 MED ORDER — ALPRAZOLAM 0.25 MG PO TABS: ORAL_TABLET | ORAL | 1 refills | Status: DC

## 2020-02-25 MED ORDER — ZOLPIDEM TARTRATE 10 MG PO TABS: 10.0000 mg | ORAL_TABLET | Freq: Every day | ORAL | 2 refills | Status: DC

## 2020-02-25 NOTE — Progress Notes (Signed)
[facility] [place], [place] [address]  Internal MEDICINE  Office Visit Note  Patient Name: Dustin Heath  [id number]  [id number]  Date of Service: 02/25/2020  Chief Complaint  Patient presents with  . Annual Exam  . Hypertension  . Hyperlipidemia  . Anxiety     HPI Pt is here for routine health maintenance examination.  He is a 60 yo male.  He has a history of HTN, OSA, HLD, and anxiety. He is unable to use cpap due to some nasal obstructions that he needs to have surgery for. His blood pressure is controlled today, he is taking 1.5 tablets of lisinopril, and that has helped greatly.   Current Medication: Outpatient Encounter Medications as of 02/25/2020  Medication Sig  . ALPRAZolam (XANAX) 0.25 MG tablet TAKE ONE TAB BY MOUTH DAILY AS NEEDED FOR ANXIETY  . amLODipine (NORVASC) 5 MG tablet TAKE 1-2 TABLETS BY MOUTH EVERY EVENING FOR BLOOD PRESSURE  . atorvastatin (LIPITOR) 10 MG tablet Take 1 tablet (10 mg total) by mouth every evening.  . citalopram (CELEXA) 40 MG tablet Take one tab po qd for generalized anxiety disorder  . cyclobenzaprine (FLEXERIL) 5 MG tablet Take 1 tablet (5 mg total) by mouth at bedtime as needed for muscle spasms.  Marland Kitchen lisinopril-hydrochlorothiazide (ZESTORETIC) 20-25 MG tablet Take 1.5 tablets by mouth every morning.  . zolpidem (AMBIEN) 10 MG tablet Take 1 tablet (10 mg total) by mouth at bedtime.   No facility-administered encounter medications on file as of 02/25/2020.    Surgical History: Past Surgical History:  Procedure Laterality Date  . APPENDECTOMY    . NOSE SURGERY      Medical History: Past Medical History:  Diagnosis Date  . Hyperlipidemia   . Hypertension   . Panic disorder     Family History: Family History  Problem Relation Age of Onset  . Hypertension Mother   . Hypertension Father       Review of Systems  Constitutional: Negative.  Negative for chills, fatigue and unexpected weight change.   HENT: Negative.  Negative for congestion, rhinorrhea, sneezing and sore throat.   Eyes: Negative for redness.  Respiratory: Negative.  Negative for cough, chest tightness and shortness of breath.   Cardiovascular: Negative.  Negative for chest pain and palpitations.  Gastrointestinal: Negative.  Negative for abdominal pain, constipation, diarrhea, nausea and vomiting.  Endocrine: Negative.   Genitourinary: Negative.  Negative for dysuria and frequency.  Musculoskeletal: Negative.  Negative for arthralgias, back pain, joint swelling and neck pain.  Skin: Negative.  Negative for rash.  Allergic/Immunologic: Negative.   Neurological: Negative.  Negative for tremors and numbness.  Hematological: Negative for adenopathy. Does not bruise/bleed easily.  Psychiatric/Behavioral: Negative.  Negative for behavioral problems, sleep disturbance and suicidal ideas. The patient is not nervous/anxious.      Vital Signs: BP 124/80   Pulse 90   Temp 97.8 F (36.6 C)   Resp 16   Ht 5\' 7"  (1.702 m)   Wt 222 lb (100.7 kg)   SpO2 97%   BMI 34.77 kg/m    Physical Exam Vitals and nursing note reviewed.  Constitutional:      General: He is not in acute distress.    Appearance: He is well-developed. He is not diaphoretic.  HENT:     Head: Normocephalic and atraumatic.     Mouth/Throat:     Pharynx: No oropharyngeal exudate.  Eyes:     Pupils: Pupils are equal, round, and reactive  to light.  Neck:     Thyroid: No thyromegaly.     Vascular: No JVD.     Trachea: No tracheal deviation.  Cardiovascular:     Rate and Rhythm: Normal rate and regular rhythm.     Heart sounds: Normal heart sounds. No murmur. No friction rub. No gallop.   Pulmonary:     Effort: Pulmonary effort is normal. No respiratory distress.     Breath sounds: Normal breath sounds. No wheezing or rales.  Chest:     Chest wall: No tenderness.  Abdominal:     Palpations: Abdomen is soft.     Tenderness: There is no abdominal  tenderness. There is no guarding.  Musculoskeletal:        General: Normal range of motion.     Cervical back: Normal range of motion and neck supple.  Lymphadenopathy:     Cervical: No cervical adenopathy.  Skin:    General: Skin is warm and dry.  Neurological:     Mental Status: He is alert and oriented to person, place, and time.     Cranial Nerves: No cranial nerve deficit.  Psychiatric:        Behavior: Behavior normal.        Thought Content: Thought content normal.        Judgment: Judgment normal.    LABS: No results found for this or any previous visit (from the past 2160 hour(s)).   Assessment/Plan: 1. Encounter for general adult medical examination with abnormal findings Pt unable to get to [place] were collected here in office, so patient was NOT fasting.  - CBC with Differential/Platelet - Lipid Panel With LDL/HDL Ratio - TSH - T4, free - Comprehensive metabolic panel  2. Essential (primary) hypertension Stable, continue increased dose of lisinopril.   3. Major depression with psychotic features ([place]) Stable on Celexa.   4. Generalized anxiety disorder Reviewed risks and possible side effects associated with taking opiates, benzodiazepines and other CNS depressants. Combination of these could cause dizziness and drowsiness. Advised patient not to drive or operate machinery when taking these medications, as patient's and other's life can be at risk and will have consequences. Patient verbalized understanding in this matter. Dependence and abuse for these drugs will be monitored closely. A Controlled substance policy and procedure is on file which allows [facility] to order a urine drug screen test at any visit. Patient understands and agrees with the plan - ALPRAZolam (XANAX) 0.25 MG tablet; Take as needed at night for anxiety  Dispense: 30 tablet; Refill: 1  5. Obstructive sleep apnea hypopnea, moderate Pt unable to tolerate cpap due to nasal  obstruction.  He is suppose to see ENT, but wants to wait to his covid shots  6. Schizoaffective disorder, depressive type ([place]) Overall doing well.  Does not see Psychiatry.   7. Nicotine dependence, cigarettes, uncomplicated Smoking cessation counseling: 1. Pt acknowledges the risks of long term smoking, she will try to quite smoking. 2. Options for different medications including nicotine products, chewing gum, patch etc, Wellbutrin and Chantix is discussed 3. Goal and date of compete cessation is discussed 4. Total time spent in smoking cessation is 15 min.  8. Dysuria - UA/M w/rflx Culture, Routine  9. Screening for prostate cancer - PSA  General Counseling: Jesstin verbalizes understanding of the findings of todays visit and agrees with plan of treatment. I have discussed any further diagnostic evaluation that may be needed or ordered today. We also reviewed  his medications today. he has been encouraged to call the office with any questions or concerns that should arise related to todays visit.   Orders Placed This Encounter  Procedures  . UA/M w/rflx Culture, Routine    No orders of the defined types were placed in this encounter.   Time spent: 30 Minutes   This patient was seen by Orson Gear AGNP-C in Collaboration with Dr Lavera Guise as a part of collaborative care agreement    Kendell Bane AGNP-C Internal Medicine

## 2020-02-26 LAB — MICROSCOPIC EXAMINATION
Bacteria, UA: NONE SEEN
Casts: NONE SEEN /lpf
Epithelial Cells (non renal): NONE SEEN /hpf (ref 0–10)
WBC, UA: NONE SEEN /hpf (ref 0–5)

## 2020-02-26 LAB — COMPREHENSIVE METABOLIC PANEL
ALT: 21 IU/L (ref 0–44)
AST: 18 IU/L (ref 0–40)
Albumin/Globulin Ratio: 1.8 (ref 1.2–2.2)
Albumin: 4.8 g/dL (ref 3.8–4.9)
Alkaline Phosphatase: 72 IU/L (ref 39–117)
BUN/Creatinine Ratio: 15 (ref 9–20)
BUN: 15 mg/dL (ref 6–24)
Bilirubin Total: 0.2 mg/dL (ref 0.0–1.2)
CO2: 21 mmol/L (ref 20–29)
Calcium: 9.8 mg/dL (ref 8.7–10.2)
Chloride: 98 mmol/L (ref 96–106)
Creatinine, Ser: 1.03 mg/dL (ref 0.76–1.27)
GFR calc Af Amer: 91 mL/min/{1.73_m2} (ref >59)
GFR calc non Af Amer: 79 mL/min/{1.73_m2} (ref >59)
Globulin, Total: 2.7 g/dL (ref 1.5–4.5)
Glucose: 118 mg/dL — ABNORMAL HIGH (ref 65–99)
Potassium: 4.1 mmol/L (ref 3.5–5.2)
Sodium: 135 mmol/L (ref 134–144)
Total Protein: 7.5 g/dL (ref 6.0–8.5)

## 2020-02-26 LAB — UA/M W/RFLX CULTURE, ROUTINE
Bilirubin, UA: NEGATIVE
Glucose, UA: NEGATIVE
Ketones, UA: NEGATIVE
Leukocytes,UA: NEGATIVE
Nitrite, UA: NEGATIVE
Protein,UA: NEGATIVE
RBC, UA: NEGATIVE
Specific Gravity, UA: 1.012 (ref 1.005–1.030)
Urobilinogen, Ur: 0.2 mg/dL (ref 0.2–1.0)
pH, UA: 6.5 (ref 5.0–7.5)

## 2020-02-26 LAB — CBC WITH DIFFERENTIAL/PLATELET
Basophils Absolute: 0.1 10*3/uL (ref 0.0–0.2)
Basos: 1 %
EOS (ABSOLUTE): 0.2 10*3/uL (ref 0.0–0.4)
Eos: 2 %
Hematocrit: 45.9 % (ref 37.5–51.0)
Hemoglobin: 16.3 g/dL (ref 13.0–17.7)
Immature Grans (Abs): 0 10*3/uL (ref 0.0–0.1)
Immature Granulocytes: 0 %
Lymphocytes Absolute: 2.1 10*3/uL (ref 0.7–3.1)
Lymphs: 22 %
MCH: 31.9 pg (ref 26.6–33.0)
MCHC: 35.5 g/dL (ref 31.5–35.7)
MCV: 90 fL (ref 79–97)
Monocytes Absolute: 0.9 10*3/uL (ref 0.1–0.9)
Monocytes: 9 %
Neutrophils Absolute: 6.6 10*3/uL (ref 1.4–7.0)
Neutrophils: 66 %
Platelets: 353 10*3/uL (ref 150–450)
RBC: 5.11 x10E6/uL (ref 4.14–5.80)
RDW: 12.4 % (ref 11.6–15.4)
WBC: 9.8 10*3/uL (ref 3.4–10.8)

## 2020-02-26 LAB — T4, FREE: Free T4: 1.56 ng/dL (ref 0.82–1.77)

## 2020-02-26 LAB — PSA: Prostate Specific Ag, Serum: 0.7 ng/mL (ref 0.0–4.0)

## 2020-02-26 LAB — LIPID PANEL WITH LDL/HDL RATIO
Cholesterol, Total: 160 mg/dL (ref 100–199)
HDL: 34 mg/dL — ABNORMAL LOW (ref >39)
LDL Chol Calc (NIH): 89 mg/dL (ref 0–99)
LDL/HDL Ratio: 2.6 ratio (ref 0.0–3.6)
Triglycerides: 220 mg/dL — ABNORMAL HIGH (ref 0–149)
VLDL Cholesterol Cal: 37 mg/dL (ref 5–40)

## 2020-02-26 LAB — TSH: TSH: 1.1 u[IU]/mL (ref 0.450–4.500)

## 2020-03-15 ENCOUNTER — Telehealth: Payer: Self-pay

## 2020-03-15 NOTE — Telephone Encounter (Signed)
LMOM FOR PT TO CONFIRM 03-17-20 OV.

## 2020-03-17 ENCOUNTER — Encounter: Payer: Self-pay | Admitting: Adult Health

## 2020-03-17 ENCOUNTER — Ambulatory Visit: Payer: Medicaid Other | Admitting: Adult Health

## 2020-03-17 VITALS — BP 123/74 | HR 69 | Resp 16 | Ht 67.0 in | Wt 222.0 lb

## 2020-03-17 DIAGNOSIS — F17210 Nicotine dependence, cigarettes, uncomplicated: Secondary | ICD-10-CM | POA: Diagnosis not present

## 2020-03-17 DIAGNOSIS — F251 Schizoaffective disorder, depressive type: Secondary | ICD-10-CM

## 2020-03-17 DIAGNOSIS — F411 Generalized anxiety disorder: Secondary | ICD-10-CM | POA: Diagnosis not present

## 2020-03-17 DIAGNOSIS — I10 Essential (primary) hypertension: Secondary | ICD-10-CM | POA: Diagnosis not present

## 2020-03-17 DIAGNOSIS — E782 Mixed hyperlipidemia: Secondary | ICD-10-CM

## 2020-03-17 NOTE — Progress Notes (Signed)
[facility] [place], [place] [address]  Internal MEDICINE  Telephone Visit  Patient Name: Dustin Heath  [id number]  [id number]  Date of Service: 03/17/2020  I connected with the patient at 1233 by telephone and verified the patients identity using two identifiers.   I discussed the limitations, risks, security and privacy concerns of performing an evaluation and management service by telephone and the availability of in person appointments. I also discussed with the patient that there may be a patient responsible charge related to the service.  The patient expressed understanding and agrees to proceed.    Chief Complaint  Patient presents with  . Telephone Assessment  . Telephone Screen  . Hyperlipidemia  . Hypertension    HPI  Pt seen via telephone. He is seen to review is lab results.  He has multiple medical problems.  His labs were drawn in the office on 02/25/20 and as a result of that, he was not fasting.  Overall his labs look good.  His blood sugar was slightly elevated, as well his cholesterol, but given that he was not fasting, it was good. He denies any complaints today.  He reports he has been feeling good, Denies Chest pain, Shortness of breath, palpitations, headache, or blurred vision.    Current Medication: Outpatient Encounter Medications as of 03/17/2020  Medication Sig  . ALPRAZolam (XANAX) 0.25 MG tablet Take as needed at night for anxiety  . amLODipine (NORVASC) 5 MG tablet TAKE 1-2 TABLETS BY MOUTH EVERY EVENING FOR BLOOD PRESSURE  . atorvastatin (LIPITOR) 10 MG tablet Take 1 tablet (10 mg total) by mouth every evening.  . citalopram (CELEXA) 40 MG tablet Take one tab po qd for generalized anxiety disorder  . cyclobenzaprine (FLEXERIL) 5 MG tablet Take 1 tablet (5 mg total) by mouth at bedtime as needed for muscle spasms.  Marland Kitchen lisinopril-hydrochlorothiazide (ZESTORETIC) 20-25 MG tablet Take 1.5 tablets by mouth every morning.  . zolpidem  (AMBIEN) 10 MG tablet Take 1 tablet (10 mg total) by mouth at bedtime.   No facility-administered encounter medications on file as of 03/17/2020.    Surgical History: Past Surgical History:  Procedure Laterality Date  . APPENDECTOMY    . NOSE SURGERY      Medical History: Past Medical History:  Diagnosis Date  . Hyperlipidemia   . Hypertension   . Panic disorder     Family History: Family History  Problem Relation Age of Onset  . Hypertension Mother   . Hypertension Father     Social History   Socioeconomic History  . Marital status: Single    Spouse name: Not on file  . Number of children: Not on file  . Years of education: Not on file  . Highest education level: Not on file  Occupational History  . Not on file  Tobacco Use  . Smoking status: Current Every Day Smoker    Packs/day: 1.00    Years: 40.00    Pack years: 40.00    Types: Cigarettes  . Smokeless tobacco: Never Used  Substance and Sexual Activity  . Alcohol use: Never  . Drug use: Never  . Sexual activity: Not on file  Other Topics Concern  . Not on file  Social History Narrative  . Not on file   Social Determinants of Health   Financial Resource Strain:   . Difficulty of Paying Living Expenses:   Food Insecurity:   . Worried About Charity fundraiser in the Last Year:   .  Ran Out of Food in the Last Year:   Transportation Needs:   . Film/video editor (Medical):   Marland Kitchen Lack of Transportation (Non-Medical):   Physical Activity:   . Days of Exercise per Week:   . Minutes of Exercise per Session:   Stress:   . Feeling of Stress :   Social Connections:   . Frequency of Communication with Friends and Family:   . Frequency of Social Gatherings with Friends and Family:   . Attends Religious Services:   . Active Member of Clubs or Organizations:   . Attends Archivist Meetings:   Marland Kitchen Marital Status:   Intimate Partner Violence:   . Fear of Current or Ex-Partner:   . Emotionally  Abused:   Marland Kitchen Physically Abused:   . Sexually Abused:       Review of Systems  Constitutional: Negative.  Negative for chills, fatigue and unexpected weight change.  HENT: Negative.  Negative for congestion, rhinorrhea, sneezing and sore throat.   Eyes: Negative for redness.  Respiratory: Negative.  Negative for cough, chest tightness and shortness of breath.   Cardiovascular: Negative.  Negative for chest pain and palpitations.  Gastrointestinal: Negative.  Negative for abdominal pain, constipation, diarrhea, nausea and vomiting.  Endocrine: Negative.   Genitourinary: Negative.  Negative for dysuria and frequency.  Musculoskeletal: Negative.  Negative for arthralgias, back pain, joint swelling and neck pain.  Skin: Negative.  Negative for rash.  Allergic/Immunologic: Negative.   Neurological: Negative.  Negative for tremors and numbness.  Hematological: Negative for adenopathy. Does not bruise/bleed easily.  Psychiatric/Behavioral: Negative.  Negative for behavioral problems, sleep disturbance and suicidal ideas. The patient is not nervous/anxious.     Vital Signs: BP 123/74   Pulse 69   Resp 16   Ht 5\' 7"  (1.702 m)   Wt 222 lb (100.7 kg)   BMI 34.77 kg/m    Observation/Objective:  Well sounding,NAD noted.   Assessment/Plan: 1. Essential (primary) hypertension Controlled, continue amoldipine.   2. Generalized anxiety disorder Stable, continue to monitor.  3. Schizoaffective disorder, depressive type ([place]) Stable, does not see psych.  Continue to monitor.   4. Nicotine dependence, cigarettes, uncomplicated Smoking cessation counseling: 1. Pt acknowledges the risks of long term smoking, she will try to quite smoking. 2. Options for different medications including nicotine products, chewing gum, patch etc, Wellbutrin and Chantix is discussed 3. Goal and date of compete cessation is discussed 4. Total time spent in smoking cessation is 15 min.  5. Mixed  hyperlipidemia Discussed lifestyle modifications, and continue Lipitor.  General Counseling: Arham verbalizes understanding of the findings of today's phone visit and agrees with plan of treatment. I have discussed any further diagnostic evaluation that may be needed or ordered today. We also reviewed his medications today. he has been encouraged to call the office with any questions or concerns that should arise related to todays visit.    No orders of the defined types were placed in this encounter.   No orders of the defined types were placed in this encounter.   Time spent: [place] [facility] Internal medicine

## 2020-03-18 ENCOUNTER — Other Ambulatory Visit: Payer: Self-pay

## 2020-03-18 DIAGNOSIS — E782 Mixed hyperlipidemia: Secondary | ICD-10-CM

## 2020-03-18 MED ORDER — ATORVASTATIN CALCIUM 10 MG PO TABS: 10.0000 mg | ORAL_TABLET | Freq: Every evening | ORAL | 1 refills | Status: DC

## 2020-05-03 ENCOUNTER — Encounter: Payer: Self-pay | Admitting: Ophthalmology

## 2020-05-04 ENCOUNTER — Other Ambulatory Visit: Payer: Self-pay

## 2020-05-04 ENCOUNTER — Encounter: Payer: Self-pay | Admitting: Ophthalmology

## 2020-05-09 ENCOUNTER — Other Ambulatory Visit: Payer: Self-pay

## 2020-05-09 ENCOUNTER — Other Ambulatory Visit
Admission: RE | Admit: 2020-05-09 | Discharge: 2020-05-09 | Disposition: A | Discharge status: Home / Self Care | Payer: Medicaid Other | Source: Ambulatory Visit | Attending: Ophthalmology | Admitting: Ophthalmology

## 2020-05-09 DIAGNOSIS — Z01812 Encounter for preprocedural laboratory examination: Secondary | ICD-10-CM | POA: Diagnosis not present

## 2020-05-09 DIAGNOSIS — Z20822 Contact with and (suspected) exposure to covid-19: Secondary | ICD-10-CM | POA: Insufficient documentation

## 2020-05-09 LAB — SARS CORONAVIRUS 2 (TAT 6-24 HRS): SARS Coronavirus 2: NEGATIVE

## 2020-05-10 NOTE — Discharge Instructions (Signed)

## 2020-05-11 ENCOUNTER — Ambulatory Visit: Payer: Medicaid Other | Admitting: Anesthesiology

## 2020-05-11 ENCOUNTER — Encounter: Payer: Self-pay | Admitting: Ophthalmology

## 2020-05-11 ENCOUNTER — Other Ambulatory Visit: Payer: Self-pay

## 2020-05-11 ENCOUNTER — Encounter
Admission: RE | Disposition: A | Discharge status: Home / Self Care | Payer: Self-pay | Source: Home / Self Care | Attending: Ophthalmology

## 2020-05-11 ENCOUNTER — Ambulatory Visit
Admission: RE | Admit: 2020-05-11 | Discharge: 2020-05-11 | Disposition: A | Discharge status: Home / Self Care | Payer: Medicaid Other | Attending: Ophthalmology | Admitting: Ophthalmology

## 2020-05-11 DIAGNOSIS — G4730 Sleep apnea, unspecified: Secondary | ICD-10-CM | POA: Insufficient documentation

## 2020-05-11 DIAGNOSIS — H2511 Age-related nuclear cataract, right eye: Secondary | ICD-10-CM | POA: Diagnosis present

## 2020-05-11 DIAGNOSIS — Z7982 Long term (current) use of aspirin: Secondary | ICD-10-CM | POA: Insufficient documentation

## 2020-05-11 DIAGNOSIS — F329 Major depressive disorder, single episode, unspecified: Secondary | ICD-10-CM | POA: Insufficient documentation

## 2020-05-11 DIAGNOSIS — I10 Essential (primary) hypertension: Secondary | ICD-10-CM | POA: Insufficient documentation

## 2020-05-11 DIAGNOSIS — Z79899 Other long term (current) drug therapy: Secondary | ICD-10-CM | POA: Insufficient documentation

## 2020-05-11 HISTORY — DX: Dyspnea, unspecified: R06.00

## 2020-05-11 HISTORY — DX: Insomnia, unspecified: G47.00

## 2020-05-11 HISTORY — DX: Depression, unspecified: F32.A

## 2020-05-11 HISTORY — DX: Other specified disorders of nose and nasal sinuses: J34.89

## 2020-05-11 HISTORY — DX: Orthopnea: R06.01

## 2020-05-11 HISTORY — DX: Sleep apnea, unspecified: G47.30

## 2020-05-11 HISTORY — PX: CATARACT EXTRACTION W/PHACO: SHX586

## 2020-05-11 SURGERY — PHACOEMULSIFICATION, CATARACT, WITH IOL INSERTION
Anesthesia: Monitor Anesthesia Care | Site: Eye | Laterality: Right

## 2020-05-11 MED ORDER — BRIMONIDINE TARTRATE-TIMOLOL 0.2-0.5 % OP SOLN
OPHTHALMIC | Status: DC | PRN
  Administered 2020-05-11: 1 [drp] via OPHTHALMIC

## 2020-05-11 MED ORDER — ACETAMINOPHEN 160 MG/5ML PO SOLN: 325.0000 mg | ORAL | Status: DC | PRN

## 2020-05-11 MED ORDER — NA HYALUR & NA CHOND-NA HYALUR 0.4-0.35 ML IO KIT
PACK | INTRAOCULAR | Status: DC | PRN
  Administered 2020-05-11: 1 mL via INTRAOCULAR

## 2020-05-11 MED ORDER — FENTANYL CITRATE (PF) 100 MCG/2ML IJ SOLN
INTRAMUSCULAR | Status: DC | PRN
  Administered 2020-05-11 (×2): 50 ug via INTRAVENOUS

## 2020-05-11 MED ORDER — TETRACAINE HCL 0.5 % OP SOLN
1.0000 [drp] | OPHTHALMIC | Status: DC | PRN
  Administered 2020-05-11 (×3): 1 [drp] via OPHTHALMIC

## 2020-05-11 MED ORDER — ACETAMINOPHEN 325 MG PO TABS: 325.0000 mg | ORAL_TABLET | ORAL | Status: DC | PRN

## 2020-05-11 MED ORDER — MIDAZOLAM HCL 2 MG/2ML IJ SOLN
INTRAMUSCULAR | Status: DC | PRN
  Administered 2020-05-11: 2 mg via INTRAVENOUS

## 2020-05-11 MED ORDER — LIDOCAINE HCL (PF) 2 % IJ SOLN
INTRAOCULAR | Status: DC | PRN
  Administered 2020-05-11: 2 mL

## 2020-05-11 MED ORDER — EPINEPHRINE PF 1 MG/ML IJ SOLN
INTRAOCULAR | Status: DC | PRN
  Administered 2020-05-11: 58 mL via OPHTHALMIC

## 2020-05-11 MED ORDER — CEFUROXIME OPHTHALMIC INJECTION 1 MG/0.1 ML
INJECTION | OPHTHALMIC | Status: DC | PRN
  Administered 2020-05-11: 0.1 mL via INTRACAMERAL

## 2020-05-11 MED ORDER — ARMC OPHTHALMIC DILATING DROPS
1.0000 "application " | OPHTHALMIC | Status: DC | PRN
  Administered 2020-05-11 (×3): 1 via OPHTHALMIC

## 2020-05-11 MED ORDER — MOXIFLOXACIN HCL 0.5 % OP SOLN
1.0000 [drp] | OPHTHALMIC | Status: DC | PRN
  Administered 2020-05-11 (×3): 1 [drp] via OPHTHALMIC

## 2020-05-11 SURGICAL SUPPLY — 23 items
CANNULA ANT/CHMB 27G (MISCELLANEOUS) ×1 IMPLANT
CANNULA ANT/CHMB 27GA (MISCELLANEOUS) ×3 IMPLANT
GLOVE SURG LX 7.5 STRW (GLOVE) ×2
GLOVE SURG LX STRL 7.5 STRW (GLOVE) ×1 IMPLANT
GLOVE SURG TRIUMPH 8.0 PF LTX (GLOVE) ×3 IMPLANT
GOWN STRL REUS W/ TWL LRG LVL3 (GOWN DISPOSABLE) ×2 IMPLANT
GOWN STRL REUS W/TWL LRG LVL3 (GOWN DISPOSABLE) ×4
LENS IOL DIOP 24.0 (Intraocular Lens) ×3 IMPLANT
LENS IOL TECNIS MONO 24.0 (Intraocular Lens) IMPLANT
MARKER SKIN DUAL TIP RULER LAB (MISCELLANEOUS) ×3 IMPLANT
NDL CAPSULORHEX 25GA (NEEDLE) ×1 IMPLANT
NDL FILTER BLUNT 18X1 1/2 (NEEDLE) ×2 IMPLANT
NEEDLE CAPSULORHEX 25GA (NEEDLE) ×3 IMPLANT
NEEDLE FILTER BLUNT 18X 1/2SAF (NEEDLE) ×4
NEEDLE FILTER BLUNT 18X1 1/2 (NEEDLE) ×2 IMPLANT
PACK CATARACT BRASINGTON (MISCELLANEOUS) ×3 IMPLANT
PACK EYE AFTER SURG (MISCELLANEOUS) ×3 IMPLANT
PACK OPTHALMIC (MISCELLANEOUS) ×3 IMPLANT
SOLUTION OPHTHALMIC SALT (MISCELLANEOUS) ×3 IMPLANT
SYR 3ML LL SCALE MARK (SYRINGE) ×6 IMPLANT
SYR TB 1ML LUER SLIP (SYRINGE) ×3 IMPLANT
WATER STERILE IRR 250ML POUR (IV SOLUTION) ×3 IMPLANT
WIPE NON LINTING 3.25X3.25 (MISCELLANEOUS) ×3 IMPLANT

## 2020-05-11 NOTE — Op Note (Signed)
LOCATION:  [place]   PREOPERATIVE DIAGNOSIS:    Nuclear sclerotic cataract right eye. H25.11   POSTOPERATIVE DIAGNOSIS:  Nuclear sclerotic cataract right eye.     PROCEDURE:  Phacoemusification with posterior chamber intraocular lens placement of the right eye   ULTRASOUND TIME: Procedure(s): CATARACT EXTRACTION PHACO AND INTRAOCULAR LENS PLACEMENT (IOC) RIGHT 4.59 00:40.1 11.4% (Right)  LENS:   Implant Name Type Inv. Item Serial No. Manufacturer Lot No. LRB No. Used Action  LENS IOL DIOP 24.0 - [id number] Intraocular Lens LENS IOL DIOP 24.0 [id number]  Right 1 Implanted         SURGEON:  Wyonia Hough, MD   ANESTHESIA:  Topical with tetracaine drops and 2% Xylocaine jelly, augmented with 1% preservative-free intracameral lidocaine.    COMPLICATIONS:  None.   DESCRIPTION OF PROCEDURE:  The patient was identified in the holding room and transported to the operating room and placed in the supine position under the operating microscope.  The right eye was identified as the operative eye and it was prepped and draped in the usual sterile ophthalmic fashion.   A 1 millimeter clear-corneal paracentesis was made at the 12:00 position.  0.5 ml of preservative-free 1% lidocaine was injected into the anterior chamber. The anterior chamber was filled with Viscoat viscoelastic.  A 2.4 millimeter keratome was used to make a near-clear corneal incision at the 9:00 position.  A curvilinear capsulorrhexis was made with a cystotome and capsulorrhexis forceps.  Balanced salt solution was used to hydrodissect and hydrodelineate the nucleus.   Phacoemulsification was then used in stop and chop fashion to remove the lens nucleus and epinucleus.  The remaining cortex was then removed using the irrigation and aspiration handpiece. Provisc was then placed into the capsular bag to distend it for lens placement.  A lens was then injected into the capsular bag.  The remaining  viscoelastic was aspirated.   Wounds were hydrated with balanced salt solution.  The anterior chamber was inflated to a physiologic pressure with balanced salt solution.  No wound leaks were noted. Cefuroxime 0.1 ml of a 10mg /ml solution was injected into the anterior chamber for a dose of 1 mg of intracameral antibiotic at the completion of the case.   Timolol and Brimonidine drops were applied to the eye.  The patient was taken to the recovery room in stable condition without complications of anesthesia or surgery.   Krew Hortman 05/11/2020, 8:39 AM

## 2020-05-11 NOTE — Anesthesia Postprocedure Evaluation (Signed)
Anesthesia Post Note  Patient: Dustin Heath  Procedure(s) Performed: CATARACT EXTRACTION PHACO AND INTRAOCULAR LENS PLACEMENT (IOC) RIGHT 4.59 00:40.1 11.4% (Right Eye)     Patient location during evaluation: PACU Anesthesia Type: MAC Level of consciousness: awake and alert Pain management: pain level controlled Vital Signs Assessment: post-procedure vital signs reviewed and stable Respiratory status: spontaneous breathing, nonlabored ventilation, respiratory function stable and patient connected to nasal cannula oxygen Cardiovascular status: stable and blood pressure returned to baseline Postop Assessment: no apparent nausea or vomiting Anesthetic complications: no    Trecia Rogers

## 2020-05-11 NOTE — H&P (Signed)

## 2020-05-11 NOTE — Anesthesia Preprocedure Evaluation (Signed)
Anesthesia Evaluation  Patient identified by MRN, date of birth, ID band Patient awake    Reviewed: Allergy & Precautions, H&P , NPO status , Patient's Chart, lab work & pertinent test results, reviewed documented beta blocker date and time   Airway Mallampati: III  TM Distance: >3 FB Neck ROM: full    Dental no notable dental hx.    Pulmonary sleep apnea , Current Smoker and Patient abstained from smoking.,    Pulmonary exam normal breath sounds clear to auscultation       Cardiovascular Exercise Tolerance: Good hypertension,  Rhythm:regular Rate:Normal     Neuro/Psych negative neurological ROS  negative psych ROS   GI/Hepatic negative GI ROS, Neg liver ROS,   Endo/Other  negative endocrine ROS  Renal/GU negative Renal ROS  negative genitourinary   Musculoskeletal   Abdominal   Peds  Hematology negative hematology ROS (+)   Anesthesia Other Findings   Reproductive/Obstetrics negative OB ROS                             Anesthesia Physical Anesthesia Plan  ASA: II  Anesthesia Plan: MAC   Post-op Pain Management:    Induction:   PONV Risk Score and Plan:   Airway Management Planned:   Additional Equipment:   Intra-op Plan:   Post-operative Plan:   Informed Consent: I have reviewed the patients History and Physical, chart, labs and discussed the procedure including the risks, benefits and alternatives for the proposed anesthesia with the patient or authorized representative who has indicated his/her understanding and acceptance.     Dental Advisory Given  Plan Discussed with: CRNA and Anesthesiologist  Anesthesia Plan Comments:         Anesthesia Quick Evaluation

## 2020-05-11 NOTE — Anesthesia Procedure Notes (Signed)
Procedure Name: MAC Date/Time: 05/11/2020 8:22 AM Performed by: Jeannene Patella, CRNA Pre-anesthesia Checklist: Patient identified, Emergency Drugs available, Suction available, Patient being monitored and Timeout performed Patient Re-evaluated:Patient Re-evaluated prior to induction Oxygen Delivery Method: Nasal cannula

## 2020-05-11 NOTE — Transfer of Care (Signed)
Immediate Anesthesia Transfer of Care Note  Patient: Dustin Heath  Procedure(s) Performed: CATARACT EXTRACTION PHACO AND INTRAOCULAR LENS PLACEMENT (IOC) RIGHT 4.59 00:40.1 11.4% (Right Eye)  Patient Location: PACU  Anesthesia Type: MAC  Level of Consciousness: awake, alert  and patient cooperative  Airway and Oxygen Therapy: Patient Spontanous Breathing and Patient connected to supplemental oxygen  Post-op Assessment: Post-op Vital signs reviewed, Patient's Cardiovascular Status Stable, Respiratory Function Stable, Patent Airway and No signs of Nausea or vomiting  Post-op Vital Signs: Reviewed and stable  Complications: No apparent anesthesia complications

## 2020-05-12 ENCOUNTER — Encounter: Payer: Self-pay | Admitting: *Deleted

## 2020-05-18 ENCOUNTER — Other Ambulatory Visit: Payer: Self-pay | Admitting: Adult Health

## 2020-05-18 DIAGNOSIS — F411 Generalized anxiety disorder: Secondary | ICD-10-CM

## 2020-05-18 DIAGNOSIS — G4700 Insomnia, unspecified: Secondary | ICD-10-CM

## 2020-05-18 NOTE — Telephone Encounter (Signed)
PT NEEDS REFILL. LAST APPT 02/25/20 AND NEXT APPT 05/23/20

## 2020-05-19 ENCOUNTER — Telehealth: Payer: Self-pay

## 2020-05-19 NOTE — Telephone Encounter (Signed)
Confirmed and screened for 05-23-20 ov. 

## 2020-05-23 ENCOUNTER — Ambulatory Visit (INDEPENDENT_AMBULATORY_CARE_PROVIDER_SITE_OTHER): Payer: Medicaid Other | Admitting: Nurse Practitioner

## 2020-05-23 ENCOUNTER — Encounter: Payer: Self-pay | Admitting: Nurse Practitioner

## 2020-05-23 VITALS — BP 149/85 | HR 80 | Temp 97.6°F | Resp 16 | Ht 67.0 in | Wt 230.8 lb

## 2020-05-23 DIAGNOSIS — F17210 Nicotine dependence, cigarettes, uncomplicated: Secondary | ICD-10-CM | POA: Diagnosis not present

## 2020-05-23 DIAGNOSIS — G4700 Insomnia, unspecified: Secondary | ICD-10-CM | POA: Diagnosis not present

## 2020-05-23 DIAGNOSIS — F411 Generalized anxiety disorder: Secondary | ICD-10-CM | POA: Diagnosis not present

## 2020-05-23 DIAGNOSIS — I10 Essential (primary) hypertension: Secondary | ICD-10-CM | POA: Diagnosis not present

## 2020-05-23 MED ORDER — AMLODIPINE BESYLATE 5 MG PO TABS: 5.0000 mg | ORAL_TABLET | Freq: Two times a day (BID) | ORAL | 3 refills | Status: DC

## 2020-05-23 MED ORDER — LISINOPRIL-HYDROCHLOROTHIAZIDE 20-25 MG PO TABS: 1.5000 | ORAL_TABLET | ORAL | 1 refills | Status: DC

## 2020-05-23 MED ORDER — ALPRAZOLAM 0.25 MG PO TABS: ORAL_TABLET | ORAL | 2 refills | Status: DC

## 2020-05-23 MED ORDER — ZOLPIDEM TARTRATE 10 MG PO TABS: 10.0000 mg | ORAL_TABLET | Freq: Every day | ORAL | 2 refills | Status: DC

## 2020-05-23 NOTE — Progress Notes (Signed)
[facility] [place], [place] [address]  Internal MEDICINE  Office Visit Note  Patient Name: Dustin Heath  [id number]  [id number]  Date of Service: 05/29/2020  Chief Complaint  Patient presents with  . Hypertension  . Anxiety  . Depression  . Hyperlipidemia    The patient is here for routine follow up. He had injection into his eye earlier today due to macular degeneration. He did have surgery to remove cataract from his right eye on May 11, 2020. Dr. Wallace Going, who did his eye surgery, told him he had some wheezing in the left lung. He states that he does have post nasal drip and congestion sometimes. he is a smoker. States that he smokes "too much." he states that he smokes at least a pack of cigarettes per day. Blood pressure is well managed, especially considering he had poor night's sleep last night and had injection into his just just prior to this appointment.  He states that his anxiety and depression are well managed. States that his mother passed away suddenly on 04/12/2020. He feels as though depression/anxiety/sleep medications really helped him to process the death normally.  The patient is concerned about short term memory loss. States that his grandmother, aunt, and uncle all suffered from dementia. He is worried he is developing dementia as well.       Current Medication: Outpatient Encounter Medications as of 05/23/2020  Medication Sig  . ALPRAZolam (XANAX) 0.25 MG tablet Take as needed at night for anxiety  . amLODipine (NORVASC) 5 MG tablet Take 1 tablet (5 mg total) by mouth in the morning and at bedtime. Take 1 tablet po BID  . aspirin EC 81 MG tablet Take 81 mg by mouth daily.  Marland Kitchen atorvastatin (LIPITOR) 10 MG tablet Take 1 tablet (10 mg total) by mouth every evening.  . Cholecalciferol (VITAMIN D-3) 25 MCG (1000 UT) CAPS Take by mouth.  . citalopram (CELEXA) 40 MG tablet Take one tab po qd for generalized anxiety disorder (Patient  taking differently: 20 mg. Take one tab po qd for generalized anxiety disorder)  . cyclobenzaprine (FLEXERIL) 5 MG tablet Take 1 tablet (5 mg total) by mouth at bedtime as needed for muscle spasms.  . fluticasone (FLONASE) 50 MCG/ACT nasal spray Place into both nostrils daily.  . Garlic [id number] MG CAPS Take by mouth.  Marland Kitchen lisinopril-hydrochlorothiazide (ZESTORETIC) 20-25 MG tablet Take 1.5 tablets by mouth every morning.  . Magnesium 250 MG TABS Take by mouth.  . Multiple Vitamins-Minerals (CENTRUM SILVER 50+MEN) TABS Take by mouth.  . vitamin E (VITAMIN E) 180 MG (400 UNITS) capsule Take 400 Units by mouth daily.  Marland Kitchen zolpidem (AMBIEN) 10 MG tablet Take 1 tablet (10 mg total) by mouth at bedtime.  . [DISCONTINUED] ALPRAZolam (XANAX) 0.25 MG tablet Take as needed at night for anxiety  . [DISCONTINUED] amLODipine (NORVASC) 5 MG tablet TAKE 1-2 TABLETS BY MOUTH EVERY EVENING FOR BLOOD PRESSURE (Patient taking differently: 10 mg. TAKE 1-2 TABLETS BY MOUTH EVERY EVENING FOR BLOOD PRESSURE)  . [DISCONTINUED] lisinopril-hydrochlorothiazide (ZESTORETIC) 20-25 MG tablet Take 1.5 tablets by mouth every morning.  . [DISCONTINUED] zolpidem (AMBIEN) 10 MG tablet Take 1 tablet (10 mg total) by mouth at bedtime.   No facility-administered encounter medications on file as of 05/23/2020.    Surgical History: Past Surgical History:  Procedure Laterality Date  . APPENDECTOMY  60 yrs old  . CATARACT EXTRACTION W/PHACO Right 05/11/2020   Procedure: CATARACT EXTRACTION PHACO AND INTRAOCULAR  LENS PLACEMENT (IOC) RIGHT 4.59 00:40.1 11.4%;  Surgeon: Leandrew Koyanagi, MD;  Location: [facility];  Service: Ophthalmology;  Laterality: Right;  . NOSE SURGERY  2000   Septorhinoplasty    Medical History: Past Medical History:  Diagnosis Date  . Depression   . Dyspnea    occasional with exertion  . Hyperlipidemia   . Hypertension    controlled on meds  . Insomnia   . Nasal obstruction    left side  nasal-severe restriction of air flow  . Orthopnea   . Panic disorder   . Sleep apnea    moderate-no CPAP    Family History: Family History  Problem Relation Age of Onset  . Hypertension Mother   . Hypertension Father     Social History   Socioeconomic History  . Marital status: Single    Spouse name: Not on file  . Number of children: Not on file  . Years of education: Not on file  . Highest education level: Not on file  Occupational History  . Not on file  Tobacco Use  . Smoking status: Current Every Day Smoker    Packs/day: 1.00    Years: 40.00    Pack years: 40.00    Types: Cigarettes  . Smokeless tobacco: Never Used  Substance and Sexual Activity  . Alcohol use: Never  . Drug use: Never  . Sexual activity: Not on file  Other Topics Concern  . Not on file  Social History Narrative  . Not on file   Social Determinants of Health   Financial Resource Strain:   . Difficulty of Paying Living Expenses:   Food Insecurity:   . Worried About Charity fundraiser in the Last Year:   . Arboriculturist in the Last Year:   Transportation Needs:   . Film/video editor (Medical):   Marland Kitchen Lack of Transportation (Non-Medical):   Physical Activity:   . Days of Exercise per Week:   . Minutes of Exercise per Session:   Stress:   . Feeling of Stress :   Social Connections:   . Frequency of Communication with Friends and Family:   . Frequency of Social Gatherings with Friends and Family:   . Attends Religious Services:   . Active Member of Clubs or Organizations:   . Attends Archivist Meetings:   Marland Kitchen Marital Status:   Intimate Partner Violence:   . Fear of Current or Ex-Partner:   . Emotionally Abused:   Marland Kitchen Physically Abused:   . Sexually Abused:       Review of Systems  Constitutional: Negative for activity change, chills, fatigue and unexpected weight change.  HENT: Positive for congestion, postnasal drip and rhinorrhea. Negative for sneezing and sore  throat.   Eyes: Negative for redness.  Respiratory: Positive for wheezing. Negative for cough, chest tightness and shortness of breath.        Reported per Dr. Mordecai Rasmussen at time of cataract removal surgery.   Cardiovascular: Negative for chest pain and palpitations.  Gastrointestinal: Negative for abdominal pain, constipation, diarrhea, nausea and vomiting.  Endocrine: Negative for cold intolerance, heat intolerance, polydipsia and polyuria.  Musculoskeletal: Negative for arthralgias, back pain, joint swelling, neck pain and neck stiffness.  Skin: Negative for rash.  Allergic/Immunologic: Positive for environmental allergies.  Neurological: Negative for dizziness, tremors, numbness and headaches.  Hematological: Negative for adenopathy. Does not bruise/bleed easily.  Psychiatric/Behavioral: Positive for sleep disturbance. Negative for behavioral problems and suicidal ideas. The patient  is nervous/anxious.     Today's Vitals   05/23/20 1057  BP: (!) 149/85  Pulse: 80  Resp: 16  Temp: 97.6 F (36.4 C)  SpO2: 98%  Weight: 230 lb 12.8 oz (104.7 kg)  Height: 5\' 7"  (1.702 m)   Body mass index is 36.15 kg/m.  Physical Exam Vitals and nursing note reviewed.  Constitutional:      General: He is not in acute distress.    Appearance: Normal appearance. He is well-developed. He is not diaphoretic.  HENT:     Head: Normocephalic and atraumatic.     Right Ear: External ear normal.     Left Ear: External ear normal.     Nose: Congestion present.     Mouth/Throat:     Pharynx: No oropharyngeal exudate.  Eyes:     Pupils: Pupils are equal, round, and reactive to light.  Neck:     Thyroid: No thyromegaly.     Vascular: No JVD.     Trachea: No tracheal deviation.  Cardiovascular:     Rate and Rhythm: Normal rate and regular rhythm.     Heart sounds: Normal heart sounds. No murmur. No friction rub. No gallop.   Pulmonary:     Effort: Pulmonary effort is normal. No respiratory  distress.     Breath sounds: Normal breath sounds. No wheezing or rales.  Chest:     Chest wall: No tenderness.  Abdominal:     Palpations: Abdomen is soft.  Musculoskeletal:        General: Normal range of motion.     Cervical back: Normal range of motion and neck supple.  Lymphadenopathy:     Cervical: No cervical adenopathy.  Skin:    General: Skin is warm and dry.  Neurological:     Mental Status: He is alert and oriented to person, place, and time. Mental status is at baseline.     Cranial Nerves: No cranial nerve deficit.  Psychiatric:        Attention and Perception: Attention and perception normal.        Mood and Affect: Affect normal. Mood is anxious.        Speech: Speech normal.        Behavior: Behavior normal. Behavior is cooperative.        Thought Content: Thought content normal.        Cognition and Memory: Cognition and memory normal.        Judgment: Judgment normal.    Assessment/Plan: 1. Essential (primary) hypertension Blood pressure stable. Continue blood pressure medication as prescribed. Refills provided today.  - lisinopril-hydrochlorothiazide (ZESTORETIC) 20-25 MG tablet; Take 1.5 tablets by mouth every morning.  Dispense: 135 tablet; Refill: 1 - amLODipine (NORVASC) 5 MG tablet; Take 1 tablet (5 mg total) by mouth in the morning and at bedtime. Take 1 tablet po BID  Dispense: 180 tablet; Refill: 3  2. Generalized anxiety disorder May continue to take alprazolam 0.25mg  every evening as needed for acute anxiety. New prescription sent to pharmacy today.  - ALPRAZolam (XANAX) 0.25 MG tablet; Take as needed at night for anxiety  Dispense: 30 tablet; Refill: 2  3. Insomnia, unspecified type May continue ambien 10mg  at bedtime as needed for insomnia. New prescription sent to her pharamcy today.  - zolpidem (AMBIEN) 10 MG tablet; Take 1 tablet (10 mg total) by mouth at bedtime.  Dispense: 30 tablet; Refill: 2  4. Nicotine dependence, cigarettes,  uncomplicated Discussed smoking cessation with patient today. Patient will  cut down cigarette smoking but is not ready to quit at this time.   General Counseling: Marzell verbalizes understanding of the findings of todays visit and agrees with plan of treatment. I have discussed any further diagnostic evaluation that may be needed or ordered today. We also reviewed his medications today. he has been encouraged to call the office with any questions or concerns that should arise related to todays visit.  Hypertension Counseling:   The following hypertensive lifestyle modification were recommended and discussed:  1. Limiting alcohol intake to less than 1 oz/day of ethanol:(24 oz of beer or 8 oz of wine or 2 oz of 100-proof whiskey). 2. Take baby ASA 81 mg daily. 3. Importance of regular aerobic exercise and losing weight. 4. Reduce dietary saturated fat and cholesterol intake for overall cardiovascular health. 5. Maintaining adequate dietary potassium, calcium, and magnesium intake. 6. Regular monitoring of the blood pressure. 7. Reduce sodium intake to less than 100 mmol/day (less than 2.3 gm of sodium or less than 6 gm of sodium choride)   This patient was seen by [place] with Dr Lavera Guise as a part of collaborative care agreement  Meds ordered this encounter  Medications  . ALPRAZolam (XANAX) 0.25 MG tablet    Sig: Take as needed at night for anxiety    Dispense:  30 tablet    Refill:  2    Order Specific Question:   Supervising Provider    Answer:   Lavera Guise [place]  . zolpidem (AMBIEN) 10 MG tablet    Sig: Take 1 tablet (10 mg total) by mouth at bedtime.    Dispense:  30 tablet    Refill:  2    Maximum Refills Reached    Order Specific Question:   Supervising Provider    Answer:   Lavera Guise [id number]  . lisinopril-hydrochlorothiazide (ZESTORETIC) 20-25 MG tablet    Sig: Take 1.5 tablets by mouth every morning.    Dispense:  135 tablet    Refill:  1     Increased dosing for this medication.    Order Specific Question:   Supervising Provider    Answer:   Lavera Guise [id number]  . amLODipine (NORVASC) 5 MG tablet    Sig: Take 1 tablet (5 mg total) by mouth in the morning and at bedtime. Take 1 tablet po BID    Dispense:  180 tablet    Refill:  3    Order Specific Question:   Supervising Provider    Answer:   Lavera Guise [id number]    Total time spent: 30 Minutes   Time spent includes review of chart, medications, test results, and follow up plan with the patient.      Dr Lavera Guise Internal medicine

## 2020-05-24 ENCOUNTER — Ambulatory Visit: Payer: Medicaid Other | Admitting: Adult Health

## 2020-06-01 ENCOUNTER — Encounter: Payer: Self-pay | Admitting: Ophthalmology

## 2020-06-01 ENCOUNTER — Other Ambulatory Visit: Payer: Self-pay

## 2020-06-06 ENCOUNTER — Other Ambulatory Visit: Payer: Self-pay

## 2020-06-06 ENCOUNTER — Other Ambulatory Visit
Admission: RE | Admit: 2020-06-06 | Discharge: 2020-06-06 | Disposition: A | Discharge status: Home / Self Care | Payer: Medicaid Other | Source: Ambulatory Visit | Attending: Ophthalmology | Admitting: Ophthalmology

## 2020-06-06 DIAGNOSIS — Z20822 Contact with and (suspected) exposure to covid-19: Secondary | ICD-10-CM | POA: Diagnosis not present

## 2020-06-06 DIAGNOSIS — Z01812 Encounter for preprocedural laboratory examination: Secondary | ICD-10-CM | POA: Insufficient documentation

## 2020-06-06 LAB — SARS CORONAVIRUS 2 (TAT 6-24 HRS): SARS Coronavirus 2: NEGATIVE

## 2020-06-06 NOTE — Discharge Instructions (Signed)

## 2020-06-08 ENCOUNTER — Ambulatory Visit
Admission: RE | Admit: 2020-06-08 | Discharge: 2020-06-08 | Disposition: A | Discharge status: Home / Self Care | Payer: Medicaid Other | Attending: Ophthalmology | Admitting: Ophthalmology

## 2020-06-08 ENCOUNTER — Ambulatory Visit: Payer: Medicaid Other | Admitting: Anesthesiology

## 2020-06-08 ENCOUNTER — Other Ambulatory Visit: Payer: Self-pay

## 2020-06-08 ENCOUNTER — Encounter: Payer: Self-pay | Admitting: Ophthalmology

## 2020-06-08 ENCOUNTER — Encounter
Admission: RE | Disposition: A | Discharge status: Home / Self Care | Payer: Self-pay | Source: Home / Self Care | Attending: Ophthalmology

## 2020-06-08 DIAGNOSIS — G4730 Sleep apnea, unspecified: Secondary | ICD-10-CM | POA: Diagnosis not present

## 2020-06-08 DIAGNOSIS — H59222 Accidental puncture and laceration of left eye and adnexa during other procedure: Secondary | ICD-10-CM | POA: Diagnosis not present

## 2020-06-08 DIAGNOSIS — Z6835 Body mass index (BMI) 35.0-35.9, adult: Secondary | ICD-10-CM | POA: Insufficient documentation

## 2020-06-08 DIAGNOSIS — E7800 Pure hypercholesterolemia, unspecified: Secondary | ICD-10-CM | POA: Insufficient documentation

## 2020-06-08 DIAGNOSIS — I10 Essential (primary) hypertension: Secondary | ICD-10-CM | POA: Insufficient documentation

## 2020-06-08 DIAGNOSIS — F17210 Nicotine dependence, cigarettes, uncomplicated: Secondary | ICD-10-CM | POA: Diagnosis not present

## 2020-06-08 DIAGNOSIS — Y831 Surgical operation with implant of artificial internal device as the cause of abnormal reaction of the patient, or of later complication, without mention of misadventure at the time of the procedure: Secondary | ICD-10-CM | POA: Diagnosis not present

## 2020-06-08 DIAGNOSIS — H2512 Age-related nuclear cataract, left eye: Secondary | ICD-10-CM | POA: Diagnosis not present

## 2020-06-08 DIAGNOSIS — Z79899 Other long term (current) drug therapy: Secondary | ICD-10-CM | POA: Insufficient documentation

## 2020-06-08 DIAGNOSIS — F419 Anxiety disorder, unspecified: Secondary | ICD-10-CM | POA: Diagnosis not present

## 2020-06-08 DIAGNOSIS — E669 Obesity, unspecified: Secondary | ICD-10-CM | POA: Insufficient documentation

## 2020-06-08 DIAGNOSIS — F329 Major depressive disorder, single episode, unspecified: Secondary | ICD-10-CM | POA: Diagnosis not present

## 2020-06-08 HISTORY — PX: CATARACT EXTRACTION W/PHACO: SHX586

## 2020-06-08 SURGERY — PHACOEMULSIFICATION, CATARACT, WITH IOL INSERTION
Anesthesia: Monitor Anesthesia Care | Site: Eye | Laterality: Left

## 2020-06-08 MED ORDER — TETRACAINE HCL 0.5 % OP SOLN
1.0000 [drp] | OPHTHALMIC | Status: DC | PRN
  Administered 2020-06-08 (×3): 1 [drp] via OPHTHALMIC

## 2020-06-08 MED ORDER — NEOMYCIN-POLYMYXIN-DEXAMETH 3.5-10000-0.1 OP OINT
TOPICAL_OINTMENT | OPHTHALMIC | Status: DC | PRN
  Administered 2020-06-08: 1 via OPHTHALMIC

## 2020-06-08 MED ORDER — FENTANYL CITRATE (PF) 100 MCG/2ML IJ SOLN
INTRAMUSCULAR | Status: DC | PRN
  Administered 2020-06-08: 100 ug via INTRAVENOUS

## 2020-06-08 MED ORDER — CEFUROXIME OPHTHALMIC INJECTION 1 MG/0.1 ML
INJECTION | OPHTHALMIC | Status: DC | PRN
  Administered 2020-06-08: 0.1 mL via INTRACAMERAL

## 2020-06-08 MED ORDER — MOXIFLOXACIN HCL 0.5 % OP SOLN
1.0000 [drp] | OPHTHALMIC | Status: DC | PRN
  Administered 2020-06-08 (×3): 1 [drp] via OPHTHALMIC

## 2020-06-08 MED ORDER — BRIMONIDINE TARTRATE-TIMOLOL 0.2-0.5 % OP SOLN
OPHTHALMIC | Status: DC | PRN
  Administered 2020-06-08: 1 [drp] via OPHTHALMIC

## 2020-06-08 MED ORDER — MIDAZOLAM HCL 2 MG/2ML IJ SOLN
INTRAMUSCULAR | Status: DC | PRN
  Administered 2020-06-08: 2 mg via INTRAVENOUS

## 2020-06-08 MED ORDER — LIDOCAINE HCL (PF) 2 % IJ SOLN
INTRAOCULAR | Status: DC | PRN
  Administered 2020-06-08: 1 mL

## 2020-06-08 MED ORDER — NA HYALUR & NA CHOND-NA HYALUR 0.4-0.35 ML IO KIT
PACK | INTRAOCULAR | Status: DC | PRN
  Administered 2020-06-08: 1 mL via INTRAOCULAR

## 2020-06-08 MED ORDER — EPINEPHRINE PF 1 MG/ML IJ SOLN
INTRAOCULAR | Status: DC | PRN
  Administered 2020-06-08: 73 mL via OPHTHALMIC

## 2020-06-08 MED ORDER — ARMC OPHTHALMIC DILATING DROPS
1.0000 "application " | OPHTHALMIC | Status: DC | PRN
  Administered 2020-06-08 (×3): 1 via OPHTHALMIC

## 2020-06-08 SURGICAL SUPPLY — 31 items
CANNULA ANT/CHMB 27G (MISCELLANEOUS) ×1 IMPLANT
CANNULA ANT/CHMB 27GA (MISCELLANEOUS) ×3 IMPLANT
GLOVE SURG LX 7.5 STRW (GLOVE) ×4
GLOVE SURG LX STRL 7.5 STRW (GLOVE) ×1 IMPLANT
GLOVE SURG TRIUMPH 8.0 PF LTX (GLOVE) ×3 IMPLANT
GOWN STRL REUS W/ TWL LRG LVL3 (GOWN DISPOSABLE) ×2 IMPLANT
GOWN STRL REUS W/TWL LRG LVL3 (GOWN DISPOSABLE) ×4
LENS IOL DIOP 23.5 (Intraocular Lens) IMPLANT
LENS IOL MP 6.0 B/C PC 23.0 (Intraocular Lens) ×2 IMPLANT
LENS IOL TECNIS MONO 23.5 (Intraocular Lens) IMPLANT
MARKER SKIN DUAL TIP RULER LAB (MISCELLANEOUS) ×3 IMPLANT
NDL CAPSULORHEX 25GA (NEEDLE) ×1 IMPLANT
NDL FILTER BLUNT 18X1 1/2 (NEEDLE) ×2 IMPLANT
NDL RETROBULBAR .5 NSTRL (NEEDLE) IMPLANT
NEEDLE CAPSULORHEX 25GA (NEEDLE) ×3 IMPLANT
NEEDLE FILTER BLUNT 18X 1/2SAF (NEEDLE) ×4
NEEDLE FILTER BLUNT 18X1 1/2 (NEEDLE) ×2 IMPLANT
PACK CATARACT BRASINGTON (MISCELLANEOUS) ×3 IMPLANT
PACK EYE AFTER SURG (MISCELLANEOUS) ×3 IMPLANT
PACK OPTHALMIC (MISCELLANEOUS) ×3 IMPLANT
PACK VIT ANT 23G (MISCELLANEOUS) ×2 IMPLANT
RING MALYGIN 7.0 (MISCELLANEOUS) IMPLANT
SOLUTION OPHTHALMIC SALT (MISCELLANEOUS) ×3 IMPLANT
SUT ETHILON 10-0 CS-B-6CS-B-6 (SUTURE) ×3
SUT VICRYL  9 0 (SUTURE)
SUT VICRYL 9 0 (SUTURE) IMPLANT
SUTURE EHLN 10-0 CS-B-6CS-B-6 (SUTURE) IMPLANT
SYR 3ML LL SCALE MARK (SYRINGE) ×6 IMPLANT
SYR TB 1ML LUER SLIP (SYRINGE) ×3 IMPLANT
WATER STERILE IRR 250ML POUR (IV SOLUTION) ×3 IMPLANT
WIPE NON LINTING 3.25X3.25 (MISCELLANEOUS) ×3 IMPLANT

## 2020-06-08 NOTE — Anesthesia Preprocedure Evaluation (Signed)
Anesthesia Evaluation  Patient identified by MRN, date of birth, ID band Patient awake    History of Anesthesia Complications Negative for: history of anesthetic complications  Airway Mallampati: II  TM Distance: >3 FB Neck ROM: Full    Dental no notable dental hx.    Pulmonary sleep apnea , Current Smoker (1 ppd) and Patient abstained from smoking.,    Pulmonary exam normal        Cardiovascular hypertension, Pt. on medications Normal cardiovascular exam     Neuro/Psych negative neurological ROS     GI/Hepatic negative GI ROS, Neg liver ROS,   Endo/Other  Obesity BMI 35  Renal/GU negative Renal ROS     Musculoskeletal   Abdominal   Peds  Hematology   Anesthesia Other Findings   Reproductive/Obstetrics                             Anesthesia Physical Anesthesia Plan  ASA: II  Anesthesia Plan: MAC   Post-op Pain Management:    Induction: Intravenous  PONV Risk Score and Plan: 0 and Treatment may vary due to age or medical condition  Airway Management Planned: Nasal Cannula and Natural Airway  Additional Equipment: None  Intra-op Plan:   Post-operative Plan:   Informed Consent: I have reviewed the patients History and Physical, chart, labs and discussed the procedure including the risks, benefits and alternatives for the proposed anesthesia with the patient or authorized representative who has indicated his/her understanding and acceptance.       Plan Discussed with: CRNA  Anesthesia Plan Comments:         Anesthesia Quick Evaluation

## 2020-06-08 NOTE — H&P (Signed)

## 2020-06-08 NOTE — Transfer of Care (Signed)
Immediate Anesthesia Transfer of Care Note  Patient: Dustin Heath  Procedure(s) Performed: CATARACT EXTRACTION PHACO AND INTRAOCULAR LENS PLACEMENT (IOC) LEFT (Left Eye)  Patient Location: PACU  Anesthesia Type: MAC  Level of Consciousness: awake, alert  and patient cooperative  Airway and Oxygen Therapy: Patient Spontanous Breathing and Patient connected to supplemental oxygen  Post-op Assessment: Post-op Vital signs reviewed, Patient's Cardiovascular Status Stable, Respiratory Function Stable, Patent Airway and No signs of Nausea or vomiting  Post-op Vital Signs: Reviewed and stable  Complications: No apparent anesthesia complications

## 2020-06-08 NOTE — Anesthesia Procedure Notes (Signed)
Procedure Name: MAC Date/Time: 06/08/2020 10:49 AM Performed by: Jeannene Patella, CRNA Pre-anesthesia Checklist: Patient identified, Emergency Drugs available, Suction available, Patient being monitored and Timeout performed Patient Re-evaluated:Patient Re-evaluated prior to induction Oxygen Delivery Method: Nasal cannula

## 2020-06-08 NOTE — Anesthesia Postprocedure Evaluation (Signed)
Anesthesia Post Note  Patient: Dustin Heath  Procedure(s) Performed: CATARACT EXTRACTION PHACO AND INTRAOCULAR LENS PLACEMENT (IOC) LEFT WITH ANTERIOR VITRECTOMY (Left Eye)     Patient location during evaluation: PACU Anesthesia Type: MAC Level of consciousness: awake and alert Pain management: pain level controlled Vital Signs Assessment: post-procedure vital signs reviewed and stable Respiratory status: spontaneous breathing Cardiovascular status: blood pressure returned to baseline Postop Assessment: no apparent nausea or vomiting, adequate PO intake and no headache Anesthetic complications: no    Adele Barthel Erandy Mceachern

## 2020-06-08 NOTE — Op Note (Signed)
OPERATIVE NOTE  Dustin Heath [id number] 06/08/2020   PREOPERATIVE DIAGNOSIS:  Nuclear sclerotic cataract left eye. H25.12   POSTOPERATIVE DIAGNOSIS:    Nuclear sclerotic cataract left eye.     PROCEDURE:  Phacoemusification with posterior chamber intraocular lens placement of the left eye   Ultrasound time: Procedure(s) with comments: CATARACT EXTRACTION PHACO AND INTRAOCULAR LENS PLACEMENT (IOC) LEFT (Left) - 4.47 0:35.8 12.4%  LENS:   Implant Name Type Inv. Item Serial No. Manufacturer Lot No. LRB No. Used Action  LENS IOL DIOP 23.5 - [id number] Intraocular Lens LENS IOL DIOP 23.5 [id number]  Left 1 Wasted  LENS IOL MP 6.0 B/C PC 23.0 - [id number] Intraocular Lens LENS IOL MP 6.0 B/C PC 23.0 [id number] ALCON  Left 1 Implanted     First lens not used.  Second IOL ([id number] 23.0 D) placed in sulcus with optic capture SURGEON:  Wyonia Hough, MD   ANESTHESIA:  Topical with tetracaine drops and 2% Xylocaine jelly, augmented with 1% preservative-free intracameral lidocaine.    COMPLICATIONS:  posterior capsule tear with anterior vitrectomy   DESCRIPTION OF PROCEDURE:  The patient was identified in the holding room and transported to the operating room and placed in the supine position under the operating microscope.  The left eye was identified as the operative eye and it was prepped and draped in the usual sterile ophthalmic fashion.   A 1 millimeter clear-corneal paracentesis was made at the 1:30 position.  0.5 ml of preservative-free 1% lidocaine was injected into the anterior chamber.  The anterior chamber was filled with Viscoat viscoelastic.  A 2.4 millimeter keratome was used to make a near-clear corneal incision at the 10:30 position.  .  A curvilinear capsulorrhexis was made with a cystotome and capsulorrhexis forceps.  Balanced salt solution was used to hydrodissect and hydrodelineate the nucleus.   Phacoemulsification was then used in stop and chop fashion to  remove the lens nucleus and epinucleus.  During removal of epinucleus, a tear was noted in the posterior capsule.  Additional viscoelastic was placed into the anterior chamber and the instruments were removed.   A new paracentesis was added at the 2:00 position. Bimanual anterior vitrectomy was performed to remove the vitreous, the remaining lens particles, and lens cortex.  The capsulorrhexis was noted to be round and there was no vitreous noted in the anterior chamber.  Wounds were checked with Weck cells and noted to be free of incarcerated vitreous strands.  The ciliary sulcus was dilated with Provisc viscoelastic.  The 2.4 mm corneal wound was enlarged to approximately 3 mm for intraocular lens placement.  An MN60AC 23.0 lens was inserted in to the ciliary sulcus.  The optic was captured through the anterior capsulorrhexis and the haptics were well-positioned in the sulcus. The bimanual vitrector was used to aspirate the viscoelastic. Wounds were again checked to ensure no vitreous was present.  The lens was well centered.  Miochol was placed into the anterior chamber.  The remaining cortex was then removed using the irrigation and aspiration handpiece. Provisc was then placed into the capsular bag to distend it for lens placement.  A lens was then injected into the capsular bag.  The remaining viscoelastic was aspirated.   Wounds were hydrated with balanced salt solution.  The anterior chamber was inflated to a physiologic pressure with balanced salt solution.  No wound leaks were noted. Cefuroxime 0.1 ml of a 10mg /ml solution was injected into the anterior chamber for a dose of 1 mg  of intracameral antibiotic at the completion of the case.   Timolol and Brimonidine drops and Maxitrol ointment were applied to the eye.  The patient was taken to the recovery room in stable condition without complications of anesthesia or surgery.  [name] 06/08/2020, 11:21 AM

## 2020-06-09 ENCOUNTER — Encounter: Payer: Self-pay | Admitting: Ophthalmology

## 2020-08-19 ENCOUNTER — Telehealth: Payer: Self-pay

## 2020-08-19 NOTE — Telephone Encounter (Signed)
Confirmed and screened for 08-23-20 ov.

## 2020-08-19 NOTE — Telephone Encounter (Signed)
Lmom to confirm and screen for 08-23-20 ov. 

## 2020-08-23 ENCOUNTER — Encounter: Payer: Self-pay | Admitting: Nurse Practitioner

## 2020-08-23 ENCOUNTER — Other Ambulatory Visit: Payer: Self-pay

## 2020-08-23 ENCOUNTER — Ambulatory Visit: Payer: Medicaid Other | Admitting: Nurse Practitioner

## 2020-08-23 VITALS — BP 138/79 | HR 94 | Temp 98.1°F | Resp 16 | Ht 67.0 in | Wt 229.6 lb

## 2020-08-23 DIAGNOSIS — G4700 Insomnia, unspecified: Secondary | ICD-10-CM | POA: Diagnosis not present

## 2020-08-23 DIAGNOSIS — I10 Essential (primary) hypertension: Secondary | ICD-10-CM

## 2020-08-23 DIAGNOSIS — F411 Generalized anxiety disorder: Secondary | ICD-10-CM

## 2020-08-23 DIAGNOSIS — E782 Mixed hyperlipidemia: Secondary | ICD-10-CM | POA: Diagnosis not present

## 2020-08-23 MED ORDER — ATORVASTATIN CALCIUM 10 MG PO TABS: 10.0000 mg | ORAL_TABLET | Freq: Every evening | ORAL | 1 refills | Status: DC

## 2020-08-23 MED ORDER — ALPRAZOLAM 0.25 MG PO TABS: ORAL_TABLET | ORAL | 2 refills | Status: DC

## 2020-08-23 MED ORDER — ZOLPIDEM TARTRATE 10 MG PO TABS: 10.0000 mg | ORAL_TABLET | Freq: Every day | ORAL | 2 refills | Status: DC

## 2020-08-23 NOTE — Progress Notes (Signed)
[facility] [place], [place] [address]  Internal MEDICINE  Office Visit Note  Patient Name: Dustin Heath  [id number]  [id number]  Date of Service: 09/05/2020  Chief Complaint  Patient presents with  . Follow-up  . Depression  . Hyperlipidemia  . Hypertension  . Medication Refill  . Quality Metric Gaps    Hep C, Tetnaus, Covid vaccine    The patient is here for routine follow up visit. States that he had bad cataract surgery on the left side. States that he had blue blocker lens placed. He has now restarted visits with pathobiologist for injections he was receiving.  He continues to have problems with sleep apnea. He is to see ENT provider to have deviated septum on left side of his nose which gives him problems with his breathing. He Is undecided about having additional surgery to repair deviated septum. States that he snores very loudly as a result.       Current Medication: Outpatient Encounter Medications as of 08/23/2020  Medication Sig  . ALPRAZolam (XANAX) 0.25 MG tablet Take as needed at night for anxiety  . amLODipine (NORVASC) 5 MG tablet Take 1 tablet (5 mg total) by mouth in the morning and at bedtime. Take 1 tablet po BID  . aspirin EC 81 MG tablet Take 81 mg by mouth daily.  Marland Kitchen atorvastatin (LIPITOR) 10 MG tablet Take 1 tablet (10 mg total) by mouth every evening.  . Cholecalciferol (VITAMIN D-3) 25 MCG (1000 UT) CAPS Take by mouth.  . citalopram (CELEXA) 40 MG tablet Take one tab po qd for generalized anxiety disorder (Patient taking differently: 20 mg. Take one tab po qd for generalized anxiety disorder)  . cyclobenzaprine (FLEXERIL) 5 MG tablet Take 1 tablet (5 mg total) by mouth at bedtime as needed for muscle spasms.  . fluticasone (FLONASE) 50 MCG/ACT nasal spray Place into both nostrils daily.  . Garlic 2956 MG CAPS Take by mouth.  Marland Kitchen lisinopril-hydrochlorothiazide (ZESTORETIC) 20-25 MG tablet Take 1.5 tablets by mouth every morning.  .  Magnesium 250 MG TABS Take by mouth.  . Multiple Vitamins-Minerals (CENTRUM SILVER 50+MEN) TABS Take by mouth.  . vitamin E (VITAMIN E) 180 MG (400 UNITS) capsule Take 400 Units by mouth daily.  Marland Kitchen zolpidem (AMBIEN) 10 MG tablet Take 1 tablet (10 mg total) by mouth at bedtime.  . [DISCONTINUED] ALPRAZolam (XANAX) 0.25 MG tablet Take as needed at night for anxiety  . [DISCONTINUED] atorvastatin (LIPITOR) 10 MG tablet Take 1 tablet (10 mg total) by mouth every evening.  . [DISCONTINUED] zolpidem (AMBIEN) 10 MG tablet Take 1 tablet (10 mg total) by mouth at bedtime.   No facility-administered encounter medications on file as of 08/23/2020.    Surgical History: Past Surgical History:  Procedure Laterality Date  . APPENDECTOMY  60 yrs old  . CATARACT EXTRACTION W/PHACO Right 05/11/2020   Procedure: CATARACT EXTRACTION PHACO AND INTRAOCULAR LENS PLACEMENT (IOC) RIGHT 4.59 00:40.1 11.4%;  Surgeon: Leandrew Koyanagi, MD;  Location: [facility];  Service: Ophthalmology;  Laterality: Right;  . CATARACT EXTRACTION W/PHACO Left 06/08/2020   Procedure: CATARACT EXTRACTION PHACO AND INTRAOCULAR LENS PLACEMENT (IOC) LEFT WITH ANTERIOR VITRECTOMY;  Surgeon: Leandrew Koyanagi, MD;  Location: [facility];  Service: Ophthalmology;  Laterality: Left;  4.47 0:35.8 12.4%  . NOSE SURGERY  2000   Septorhinoplasty    Medical History: Past Medical History:  Diagnosis Date  . Depression   . Dyspnea    occasional with exertion  .  Hyperlipidemia   . Hypertension    controlled on meds  . Insomnia   . Nasal obstruction    left side nasal-severe restriction of air flow  . Orthopnea   . Panic disorder   . Sleep apnea    moderate-no CPAP    Family History: Family History  Problem Relation Age of Onset  . Hypertension Mother   . Hypertension Father     Social History   Socioeconomic History  . Marital status: Single    Spouse name: Not on file  . Number of children: Not on file   . Years of education: Not on file  . Highest education level: Not on file  Occupational History  . Not on file  Tobacco Use  . Smoking status: Current Every Day Smoker    Packs/day: 1.00    Years: 40.00    Pack years: 40.00    Types: Cigarettes  . Smokeless tobacco: Never Used  Vaping Use  . Vaping Use: Never used  Substance and Sexual Activity  . Alcohol use: Never  . Drug use: Never  . Sexual activity: Not on file  Other Topics Concern  . Not on file  Social History Narrative  . Not on file   Social Determinants of Health   Financial Resource Strain:   . Difficulty of Paying Living Expenses: Not on file  Food Insecurity:   . Worried About Charity fundraiser in the Last Year: Not on file  . Ran Out of Food in the Last Year: Not on file  Transportation Needs:   . Lack of Transportation (Medical): Not on file  . Lack of Transportation (Non-Medical): Not on file  Physical Activity:   . Days of Exercise per Week: Not on file  . Minutes of Exercise per Session: Not on file  Stress:   . Feeling of Stress : Not on file  Social Connections:   . Frequency of Communication with Friends and Family: Not on file  . Frequency of Social Gatherings with Friends and Family: Not on file  . Attends Religious Services: Not on file  . Active Member of Clubs or Organizations: Not on file  . Attends Archivist Meetings: Not on file  . Marital Status: Not on file  Intimate Partner Violence:   . Fear of Current or Ex-Partner: Not on file  . Emotionally Abused: Not on file  . Physically Abused: Not on file  . Sexually Abused: Not on file      Review of Systems  Constitutional: Negative for activity change, chills, fatigue and unexpected weight change.  HENT: Positive for congestion, postnasal drip, sinus pressure and sinus pain. Negative for rhinorrhea, sneezing and sore throat.   Respiratory: Negative for cough, chest tightness, shortness of breath and wheezing.    Cardiovascular: Negative for chest pain and palpitations.  Gastrointestinal: Negative for abdominal pain, constipation, diarrhea, nausea and vomiting.  Endocrine: Negative for cold intolerance, heat intolerance, polydipsia and polyuria.  Musculoskeletal: Negative for arthralgias, back pain, joint swelling, neck pain and neck stiffness.  Skin: Negative for rash.  Allergic/Immunologic: Positive for environmental allergies.  Neurological: Negative for dizziness, tremors, numbness and headaches.  Hematological: Negative for adenopathy. Does not bruise/bleed easily.  Psychiatric/Behavioral: Positive for sleep disturbance. Negative for behavioral problems and suicidal ideas. The patient is nervous/anxious.    Today's Vitals   08/23/20 1129  BP: 138/79  Pulse: 94  Resp: 16  Temp: 98.1 F (36.7 C)  SpO2: 95%  Weight: 229 lb  9.6 oz (104.1 kg)  Height: 5\' 7"  (1.702 m)   Body mass index is 35.96 kg/m.  Physical Exam Vitals and nursing note reviewed.  Constitutional:      General: He is not in acute distress.    Appearance: Normal appearance. He is well-developed. He is not diaphoretic.  HENT:     Head: Normocephalic and atraumatic.     Right Ear: External ear normal.     Left Ear: External ear normal.     Nose: Congestion present.     Mouth/Throat:     Pharynx: No oropharyngeal exudate.  Eyes:     Pupils: Pupils are equal, round, and reactive to light.  Neck:     Thyroid: No thyromegaly.     Vascular: No carotid bruit or JVD.     Trachea: No tracheal deviation.  Cardiovascular:     Rate and Rhythm: Normal rate and regular rhythm.     Heart sounds: Normal heart sounds. No murmur heard.  No friction rub. No gallop.   Pulmonary:     Effort: Pulmonary effort is normal. No respiratory distress.     Breath sounds: Normal breath sounds. No wheezing or rales.  Chest:     Chest wall: No tenderness.  Abdominal:     Palpations: Abdomen is soft.  Musculoskeletal:        General:  Normal range of motion.     Cervical back: Normal range of motion and neck supple.  Lymphadenopathy:     Cervical: No cervical adenopathy.  Skin:    General: Skin is warm and dry.  Neurological:     Mental Status: He is alert and oriented to person, place, and time. Mental status is at baseline.     Cranial Nerves: No cranial nerve deficit.  Psychiatric:        Attention and Perception: Attention and perception normal.        Mood and Affect: Affect normal. Mood is anxious.        Speech: Speech normal.        Behavior: Behavior normal. Behavior is cooperative.        Thought Content: Thought content normal.        Cognition and Memory: Cognition and memory normal.        Judgment: Judgment normal.    Assessment/Plan: 1. Essential (primary) hypertension Stable. Continue bp medication as prescribed   2. Mixed hyperlipidemia Continue atorvastatin as prescribed  - atorvastatin (LIPITOR) 10 MG tablet; Take 1 tablet (10 mg total) by mouth every evening.  Dispense: 90 tablet; Refill: 1  3. Insomnia, unspecified type May continue to take ambien 10mg  at bedtime as needed for insomnia. New prescription sent to his pharmacy today.  - zolpidem (AMBIEN) 10 MG tablet; Take 1 tablet (10 mg total) by mouth at bedtime.  Dispense: 30 tablet; Refill: 2  4. Generalized anxiety disorder May take alprazolam 0.25mg  every evening as needed for acute anxiety. New prescription sent to his pharmacy today.  - ALPRAZolam (XANAX) 0.25 MG tablet; Take as needed at night for anxiety  Dispense: 30 tablet; Refill: 2  General Counseling: Schneur verbalizes understanding of the findings of todays visit and agrees with plan of treatment. I have discussed any further diagnostic evaluation that may be needed or ordered today. We also reviewed his medications today. he has been encouraged to call the office with any questions or concerns that should arise related to todays visit.   Hypertension Counseling:   The  following hypertensive lifestyle  modification were recommended and discussed:  1. Limiting alcohol intake to less than 1 oz/day of ethanol:(24 oz of beer or 8 oz of wine or 2 oz of 100-proof whiskey). 2. Take baby ASA 81 mg daily. 3. Importance of regular aerobic exercise and losing weight. 4. Reduce dietary saturated fat and cholesterol intake for overall cardiovascular health. 5. Maintaining adequate dietary potassium, calcium, and magnesium intake. 6. Regular monitoring of the blood pressure. 7. Reduce sodium intake to less than 100 mmol/day (less than 2.3 gm of sodium or less than 6 gm of sodium choride)   This patient was seen by [place] with Dr Lavera Guise as a part of collaborative care agreement  Meds ordered this encounter  Medications  . zolpidem (AMBIEN) 10 MG tablet    Sig: Take 1 tablet (10 mg total) by mouth at bedtime.    Dispense:  30 tablet    Refill:  2    Maximum Refills Reached    Order Specific Question:   Supervising Provider    Answer:   Lavera Guise [[id number]]  . ALPRAZolam (XANAX) 0.25 MG tablet    Sig: Take as needed at night for anxiety    Dispense:  30 tablet    Refill:  2    Order Specific Question:   Supervising Provider    Answer:   Lavera Guise [place]  . atorvastatin (LIPITOR) 10 MG tablet    Sig: Take 1 tablet (10 mg total) by mouth every evening.    Dispense:  90 tablet    Refill:  1    Please fill as 90 day prescription.    Order Specific Question:   Supervising Provider    Answer:   Lavera Guise [[id number]]    Total time spent: 30 Minutes   Time spent includes review of chart, medications, test results, and follow up plan with the patient.      Dr Lavera Guise Internal medicine

## 2020-10-17 ENCOUNTER — Other Ambulatory Visit: Payer: Self-pay | Admitting: Internal Medicine

## 2020-10-17 DIAGNOSIS — F323 Major depressive disorder, single episode, severe with psychotic features: Secondary | ICD-10-CM

## 2020-11-17 ENCOUNTER — Other Ambulatory Visit: Payer: Self-pay

## 2020-11-17 ENCOUNTER — Encounter: Payer: Self-pay | Admitting: Nurse Practitioner

## 2020-11-17 ENCOUNTER — Ambulatory Visit: Payer: Medicaid Other | Admitting: Nurse Practitioner

## 2020-11-17 VITALS — BP 118/70 | HR 96 | Temp 98.0°F | Resp 16 | Ht 67.0 in | Wt 236.0 lb

## 2020-11-17 DIAGNOSIS — Z79899 Other long term (current) drug therapy: Secondary | ICD-10-CM

## 2020-11-17 DIAGNOSIS — J342 Deviated nasal septum: Secondary | ICD-10-CM | POA: Diagnosis not present

## 2020-11-17 DIAGNOSIS — I10 Essential (primary) hypertension: Secondary | ICD-10-CM

## 2020-11-17 DIAGNOSIS — F411 Generalized anxiety disorder: Secondary | ICD-10-CM

## 2020-11-17 DIAGNOSIS — E782 Mixed hyperlipidemia: Secondary | ICD-10-CM

## 2020-11-17 DIAGNOSIS — G4700 Insomnia, unspecified: Secondary | ICD-10-CM

## 2020-11-17 LAB — POCT URINE DRUG SCREEN
POC Amphetamine UR: NOT DETECTED
POC BENZODIAZEPINES UR: NOT DETECTED
POC Barbiturate UR: NOT DETECTED
POC Cocaine UR: NOT DETECTED
POC Ecstasy UR: NOT DETECTED
POC Marijuana UR: NOT DETECTED
POC Methadone UR: NOT DETECTED
POC Methamphetamine UR: NOT DETECTED
POC Opiate Ur: NOT DETECTED
POC Oxycodone UR: NOT DETECTED
POC PHENCYCLIDINE UR: NOT DETECTED
POC TRICYCLICS UR: NOT DETECTED

## 2020-11-17 MED ORDER — ALPRAZOLAM 0.25 MG PO TABS: ORAL_TABLET | ORAL | 2 refills | Status: DC

## 2020-11-17 MED ORDER — ZOLPIDEM TARTRATE 10 MG PO TABS: 10.0000 mg | ORAL_TABLET | Freq: Every day | ORAL | 2 refills | Status: DC

## 2020-11-17 NOTE — Progress Notes (Signed)
[facility] [place], [place] [address]  Internal MEDICINE  Office Visit Note  Patient Name: Dustin Heath  [id number]  [id number]  Date of Service: 11/17/2020  Chief Complaint  Patient presents with  . Follow-up  . Depression  . Hyperlipidemia  . Hypertension  . Quality Metric Gaps    flu,tetnaus, covid  . controlled substance form    reviewed with PT  . Medication Refill    The patient is here for follow up visit. He states that blood pressure is elevated because he has not been sleeping well. He frequently wakes himself up snoring. He broke his nose several years ago and it has healed up crooked, leaning toward the left side. At [facility], he did have an attempt at surgical repair, but this was unsuccessful. Did have septoplasty on right side, but is unable to breathe normally out of the left side of his nose. He would like to have a referral to local ENT provider to see if there is something else that can be done to help his nose. He does have sleep apnea, however, due to the occlusion in the right side of his nose, he has been unable to tolerate a CPAP mask.  He does have anxiety.  We have decreased his alprazolam 0.25mg  daily of needed for acute anxiety. He states that he does take ambien to help him sleep most nights. Generally takes only 1/2 to 1/2 tablet at night. Has tried trazodone and vistaril in the past. Both caused him to have nightmares.       Current Medication: Outpatient Encounter Medications as of 11/17/2020  Medication Sig  . ALPRAZolam (XANAX) 0.25 MG tablet Take as needed at night for anxiety  . amLODipine (NORVASC) 5 MG tablet Take 1 tablet (5 mg total) by mouth in the morning and at bedtime. Take 1 tablet po BID  . aspirin EC 81 MG tablet Take 81 mg by mouth daily.  Marland Kitchen atorvastatin (LIPITOR) 10 MG tablet Take 1 tablet (10 mg total) by mouth every evening.  . Cholecalciferol (VITAMIN D-3) 25 MCG (1000 UT) CAPS Take by mouth.  . citalopram  (CELEXA) 40 MG tablet TAKE ONE TAB BY MOUTH DAILY FOR GENERALIZED ANXIETY DISORDER  . cyclobenzaprine (FLEXERIL) 5 MG tablet Take 1 tablet (5 mg total) by mouth at bedtime as needed for muscle spasms.  . fluticasone (FLONASE) 50 MCG/ACT nasal spray Place into both nostrils daily.  . Garlic 4765 MG CAPS Take by mouth.  Marland Kitchen lisinopril-hydrochlorothiazide (ZESTORETIC) 20-25 MG tablet Take 1.5 tablets by mouth every morning.  . Magnesium 250 MG TABS Take by mouth.  . Multiple Vitamins-Minerals (CENTRUM SILVER 50+MEN) TABS Take by mouth.  . vitamin E (VITAMIN E) 180 MG (400 UNITS) capsule Take 400 Units by mouth daily.  Marland Kitchen zolpidem (AMBIEN) 10 MG tablet Take 1 tablet (10 mg total) by mouth at bedtime.  . [DISCONTINUED] ALPRAZolam (XANAX) 0.25 MG tablet Take as needed at night for anxiety  . [DISCONTINUED] zolpidem (AMBIEN) 10 MG tablet Take 1 tablet (10 mg total) by mouth at bedtime.   No facility-administered encounter medications on file as of 11/17/2020.    Surgical History: Past Surgical History:  Procedure Laterality Date  . APPENDECTOMY  60 yrs old  . CATARACT EXTRACTION W/PHACO Right 05/11/2020   Procedure: CATARACT EXTRACTION PHACO AND INTRAOCULAR LENS PLACEMENT (IOC) RIGHT 4.59 00:40.1 11.4%;  Surgeon: Leandrew Koyanagi, MD;  Location: [facility];  Service: Ophthalmology;  Laterality: Right;  . CATARACT EXTRACTION W/PHACO  Left 06/08/2020   Procedure: CATARACT EXTRACTION PHACO AND INTRAOCULAR LENS PLACEMENT (IOC) LEFT WITH ANTERIOR VITRECTOMY;  Surgeon: Leandrew Koyanagi, MD;  Location: [facility];  Service: Ophthalmology;  Laterality: Left;  4.47 0:35.8 12.4%  . NOSE SURGERY  2000   Septorhinoplasty    Medical History: Past Medical History:  Diagnosis Date  . Depression   . Dyspnea    occasional with exertion  . Hyperlipidemia   . Hypertension    controlled on meds  . Insomnia   . Nasal obstruction    left side nasal-severe restriction of air flow  .  Orthopnea   . Panic disorder   . Sleep apnea    moderate-no CPAP    Family History: Family History  Problem Relation Age of Onset  . Hypertension Mother   . Hypertension Father     Social History   Socioeconomic History  . Marital status: Single    Spouse name: Not on file  . Number of children: Not on file  . Years of education: Not on file  . Highest education level: Not on file  Occupational History  . Not on file  Tobacco Use  . Smoking status: Current Every Day Smoker    Packs/day: 1.00    Years: 40.00    Pack years: 40.00    Types: Cigarettes  . Smokeless tobacco: Never Used  Vaping Use  . Vaping Use: Never used  Substance and Sexual Activity  . Alcohol use: Never  . Drug use: Never  . Sexual activity: Not on file  Other Topics Concern  . Not on file  Social History Narrative  . Not on file   Social Determinants of Health   Financial Resource Strain:   . Difficulty of Paying Living Expenses: Not on file  Food Insecurity:   . Worried About Charity fundraiser in the Last Year: Not on file  . Ran Out of Food in the Last Year: Not on file  Transportation Needs:   . Lack of Transportation (Medical): Not on file  . Lack of Transportation (Non-Medical): Not on file  Physical Activity:   . Days of Exercise per Week: Not on file  . Minutes of Exercise per Session: Not on file  Stress:   . Feeling of Stress : Not on file  Social Connections:   . Frequency of Communication with Friends and Family: Not on file  . Frequency of Social Gatherings with Friends and Family: Not on file  . Attends Religious Services: Not on file  . Active Member of Clubs or Organizations: Not on file  . Attends Archivist Meetings: Not on file  . Marital Status: Not on file  Intimate Partner Violence:   . Fear of Current or Ex-Partner: Not on file  . Emotionally Abused: Not on file  . Physically Abused: Not on file  . Sexually Abused: Not on file      Review of  Systems  Constitutional: Positive for fatigue. Negative for activity change, chills and unexpected weight change.  HENT: Positive for congestion. Negative for postnasal drip, rhinorrhea, sneezing and sore throat.   Respiratory: Negative for cough, chest tightness and shortness of breath.        Snoring with deviated nasal septum.   Cardiovascular: Negative for chest pain and palpitations.  Gastrointestinal: Negative for abdominal pain, constipation, diarrhea, nausea and vomiting.  Endocrine: Negative for cold intolerance, heat intolerance, polydipsia and polyuria.  Musculoskeletal: Negative for arthralgias, back pain, joint swelling and neck  pain.  Skin: Negative for rash.  Allergic/Immunologic: Positive for environmental allergies.  Neurological: Negative for dizziness, tremors, numbness and headaches.  Hematological: Negative for adenopathy. Does not bruise/bleed easily.  Psychiatric/Behavioral: Positive for sleep disturbance. Negative for behavioral problems (Depression) and suicidal ideas. The patient is nervous/anxious.     Today's Vitals   11/17/20 0844  BP: 118/70  Pulse: 96  Resp: 16  Temp: 98 F (36.7 C)  SpO2: 96%  Weight: 236 lb (107 kg)  Height: 5\' 7"  (1.702 m)   Body mass index is 36.96 kg/m.    Physical Exam Vitals and nursing note reviewed.  Constitutional:      General: He is not in acute distress.    Appearance: Normal appearance. He is well-developed. He is obese. He is not diaphoretic.  HENT:     Head: Normocephalic and atraumatic.     Nose: Nasal deformity, septal deviation, nasal tenderness and congestion present.     Mouth/Throat:     Pharynx: No oropharyngeal exudate.  Eyes:     Pupils: Pupils are equal, round, and reactive to light.  Neck:     Thyroid: No thyromegaly.     Vascular: No carotid bruit or JVD.     Trachea: No tracheal deviation.  Cardiovascular:     Rate and Rhythm: Normal rate and regular rhythm.     Heart sounds: Normal heart  sounds. No murmur heard.  No friction rub. No gallop.   Pulmonary:     Effort: Pulmonary effort is normal. No respiratory distress.     Breath sounds: Normal breath sounds. No wheezing or rales.  Chest:     Chest wall: No tenderness.  Abdominal:     Palpations: Abdomen is soft.  Musculoskeletal:        General: Normal range of motion.     Cervical back: Normal range of motion and neck supple.  Lymphadenopathy:     Cervical: No cervical adenopathy.  Skin:    General: Skin is warm and dry.  Neurological:     Mental Status: He is alert and oriented to person, place, and time. Mental status is at baseline.     Cranial Nerves: No cranial nerve deficit.  Psychiatric:        Attention and Perception: Attention and perception normal.        Mood and Affect: Affect normal. Mood is anxious.        Speech: Speech normal.        Behavior: Behavior normal. Behavior is cooperative.        Thought Content: Thought content normal.        Cognition and Memory: Cognition and memory normal.        Judgment: Judgment normal.    Assessment/Plan: 1. Essential (primary) hypertension Stable. Continue bp medication as prescribed   2. Mixed hyperlipidemia Continue atorvastatin sa prescribed   3. Acquired deviated nasal septum New referral to ENT today for further evaluation and treatment.  - Ambulatory referral to ENT  4. Generalized anxiety disorder Continue citalopram daily. May take alprazolam 0.25mg  daily as needed. New prescription sent to his phamracy today.  - ALPRAZolam (XANAX) 0.25 MG tablet; Take as needed at night for anxiety  Dispense: 30 tablet; Refill: 2  5. Insomnia, unspecified type May take ambien at bedtime as needed for insomnia. Encouraged patient to take 1/2 tablet if possible and wean dosing as tolerated.  - zolpidem (AMBIEN) 10 MG tablet; Take 1 tablet (10 mg total) by mouth at bedtime.  Dispense: 30 tablet; Refill: 2  6. Encounter for long-term (current) use of  medications - POCT Urine Drug Screen negative for all controlled medication today.    General Counseling: Sheron verbalizes understanding of the findings of todays visit and agrees with plan of treatment. I have discussed any further diagnostic evaluation that may be needed or ordered today. We also reviewed his medications today. he has been encouraged to call the office with any questions or concerns that should arise related to todays visit.  This patient was seen by Leretha Pol FNP Collaboration with Dr Lavera Guise as a part of collaborative care agreement  Orders Placed This Encounter  Procedures  . Ambulatory referral to ENT  . POCT Urine Drug Screen    Meds ordered this encounter  Medications  . ALPRAZolam (XANAX) 0.25 MG tablet    Sig: Take as needed at night for anxiety    Dispense:  30 tablet    Refill:  2    Order Specific Question:   Supervising Provider    Answer:   Lavera Guise [place]  . zolpidem (AMBIEN) 10 MG tablet    Sig: Take 1 tablet (10 mg total) by mouth at bedtime.    Dispense:  30 tablet    Refill:  2    Maximum Refills Reached    Order Specific Question:   Supervising Provider    Answer:   Lavera Guise [[id number]]    Total time spent: 30 Minutes   Time spent includes review of chart, medications, test results, and follow up plan with the patient.      Dr Lavera Guise Internal medicine

## 2020-11-28 ENCOUNTER — Ambulatory Visit: Payer: Medicaid Other | Admitting: Nurse Practitioner

## 2021-01-11 ENCOUNTER — Other Ambulatory Visit: Payer: Self-pay | Admitting: Internal Medicine

## 2021-01-11 DIAGNOSIS — F323 Major depressive disorder, single episode, severe with psychotic features: Secondary | ICD-10-CM

## 2021-02-09 ENCOUNTER — Other Ambulatory Visit: Payer: Self-pay

## 2021-02-09 DIAGNOSIS — F411 Generalized anxiety disorder: Secondary | ICD-10-CM

## 2021-02-09 DIAGNOSIS — G4700 Insomnia, unspecified: Secondary | ICD-10-CM

## 2021-02-09 MED ORDER — ZOLPIDEM TARTRATE 10 MG PO TABS: 10.0000 mg | ORAL_TABLET | Freq: Every day | ORAL | 0 refills | Status: DC

## 2021-02-09 MED ORDER — ALPRAZOLAM 0.25 MG PO TABS: ORAL_TABLET | ORAL | 0 refills | Status: DC

## 2021-02-27 ENCOUNTER — Ambulatory Visit (INDEPENDENT_AMBULATORY_CARE_PROVIDER_SITE_OTHER): Payer: Medicaid Other | Admitting: Internal Medicine

## 2021-02-27 ENCOUNTER — Encounter: Payer: Self-pay | Admitting: Physician Assistant

## 2021-02-27 ENCOUNTER — Other Ambulatory Visit: Payer: Self-pay

## 2021-02-27 VITALS — BP 126/80 | HR 103 | Temp 97.4°F | Resp 16 | Ht 67.0 in | Wt 236.0 lb

## 2021-02-27 DIAGNOSIS — Z79899 Other long term (current) drug therapy: Secondary | ICD-10-CM | POA: Diagnosis not present

## 2021-02-27 DIAGNOSIS — F411 Generalized anxiety disorder: Secondary | ICD-10-CM

## 2021-02-27 DIAGNOSIS — Z125 Encounter for screening for malignant neoplasm of prostate: Secondary | ICD-10-CM | POA: Diagnosis not present

## 2021-02-27 DIAGNOSIS — I10 Essential (primary) hypertension: Secondary | ICD-10-CM | POA: Diagnosis not present

## 2021-02-27 DIAGNOSIS — Z0001 Encounter for general adult medical examination with abnormal findings: Secondary | ICD-10-CM

## 2021-02-27 DIAGNOSIS — E782 Mixed hyperlipidemia: Secondary | ICD-10-CM

## 2021-02-27 DIAGNOSIS — R300 Dysuria: Secondary | ICD-10-CM

## 2021-02-27 DIAGNOSIS — G4700 Insomnia, unspecified: Secondary | ICD-10-CM

## 2021-02-27 DIAGNOSIS — Z6836 Body mass index (BMI) 36.0-36.9, adult: Secondary | ICD-10-CM

## 2021-02-27 MED ORDER — ZOLPIDEM TARTRATE 10 MG PO TABS: 10.0000 mg | ORAL_TABLET | Freq: Every day | ORAL | 2 refills | Status: DC

## 2021-02-27 MED ORDER — ALPRAZOLAM 0.25 MG PO TABS: ORAL_TABLET | ORAL | 2 refills | Status: DC

## 2021-02-27 NOTE — Progress Notes (Signed)
[facility] [place], [place] [address]  Internal MEDICINE  Office Visit Note  Patient Name: Dustin Heath  [id number]  [id number]  Date of Service: 02/28/2021  Chief Complaint  Patient presents with  . Annual Exam    Refill request   . Depression  . Sleep Apnea  . Hyperlipidemia  . Hypertension  . Anxiety     HPI Pt is here for routine health maintenance examination. C/o about his nose all messed up!! He he has not been sleeping well. He frequently wakes himself up snoring. He broke his nose several years ago and it has healed up crooked, leaning toward the left side. At [facility], he did have an attempt at surgical repair, but this was unsuccessful. Did have septoplasty on right side, but is unable to breathe normally out of the left side of his nose. He would like to have a referral to local ENT provider to see if there is something else that can be done to help his nose. He does have sleep apnea, however, due to the occlusion in the right side of his nose, he has been unable to tolerate a CPAP mask.  He does have anxiety Blood pressure is under good control Takes Ambien for insomnia   Current Medication: Outpatient Encounter Medications as of 02/27/2021  Medication Sig  . ALPRAZolam (XANAX) 0.25 MG tablet Take as needed at night for anxiety  . amLODipine (NORVASC) 5 MG tablet Take 1 tablet (5 mg total) by mouth in the morning and at bedtime. Take 1 tablet po BID  . aspirin EC 81 MG tablet Take 81 mg by mouth daily.  Marland Kitchen atorvastatin (LIPITOR) 10 MG tablet Take 1 tablet (10 mg total) by mouth every evening.  . Cholecalciferol (VITAMIN D-3) 25 MCG (1000 UT) CAPS Take by mouth.  . citalopram (CELEXA) 40 MG tablet TAKE ONE TAB BY MOUTH DAILY FOR GENERALIZED ANXIETY DISORDER  . cyclobenzaprine (FLEXERIL) 5 MG tablet Take 1 tablet (5 mg total) by mouth at bedtime as needed for muscle spasms.  . fluticasone (FLONASE) 50 MCG/ACT nasal spray Place into both nostrils  daily.  . Garlic 5400 MG CAPS Take by mouth.  Marland Kitchen lisinopril-hydrochlorothiazide (ZESTORETIC) 20-25 MG tablet Take 1.5 tablets by mouth every morning.  . Magnesium 250 MG TABS Take by mouth.  . Multiple Vitamins-Minerals (CENTRUM SILVER 50+MEN) TABS Take by mouth.  . vitamin E (VITAMIN E) 180 MG (400 UNITS) capsule Take 400 Units by mouth daily.  Marland Kitchen zolpidem (AMBIEN) 10 MG tablet Take 1 tablet (10 mg total) by mouth at bedtime.  . [DISCONTINUED] ALPRAZolam (XANAX) 0.25 MG tablet Take as needed at night for anxiety  . [DISCONTINUED] zolpidem (AMBIEN) 10 MG tablet Take 1 tablet (10 mg total) by mouth at bedtime.   No facility-administered encounter medications on file as of 02/27/2021.    Surgical History: Past Surgical History:  Procedure Laterality Date  . APPENDECTOMY  61 yrs old  . CATARACT EXTRACTION W/PHACO Right 05/11/2020   Procedure: CATARACT EXTRACTION PHACO AND INTRAOCULAR LENS PLACEMENT (IOC) RIGHT 4.59 00:40.1 11.4%;  Surgeon: Leandrew Koyanagi, MD;  Location: [facility];  Service: Ophthalmology;  Laterality: Right;  . CATARACT EXTRACTION W/PHACO Left 06/08/2020   Procedure: CATARACT EXTRACTION PHACO AND INTRAOCULAR LENS PLACEMENT (IOC) LEFT WITH ANTERIOR VITRECTOMY;  Surgeon: Leandrew Koyanagi, MD;  Location: [facility];  Service: Ophthalmology;  Laterality: Left;  4.47 0:35.8 12.4%  . NOSE SURGERY  2000   Septorhinoplasty    Medical History: Past  Medical History:  Diagnosis Date  . Depression   . Dyspnea    occasional with exertion  . Hyperlipidemia   . Hypertension    controlled on meds  . Insomnia   . Nasal obstruction    left side nasal-severe restriction of air flow  . Orthopnea   . Panic disorder   . Sleep apnea    moderate-no CPAP    Family History: Family History  Problem Relation Age of Onset  . Hypertension Mother   . Hypertension Father     Social History: Social History   Socioeconomic History  . Marital status: Single     Spouse name: Not on file  . Number of children: Not on file  . Years of education: Not on file  . Highest education level: Not on file  Occupational History  . Not on file  Tobacco Use  . Smoking status: Current Every Day Smoker    Packs/day: 1.00    Years: 40.00    Pack years: 40.00    Types: Cigarettes  . Smokeless tobacco: Never Used  Vaping Use  . Vaping Use: Never used  Substance and Sexual Activity  . Alcohol use: Never  . Drug use: Never  . Sexual activity: Not on file  Other Topics Concern  . Not on file  Social History Narrative  . Not on file   Social Determinants of Health   Financial Resource Strain: Not on file  Food Insecurity: Not on file  Transportation Needs: Not on file  Physical Activity: Not on file  Stress: Not on file  Social Connections: Not on file      Review of Systems  Constitutional: Negative for chills, fatigue and unexpected weight change.  HENT: Positive for postnasal drip and sinus pain. Negative for congestion, rhinorrhea, sneezing and sore throat.   Eyes: Negative for redness.  Respiratory: Negative for cough, chest tightness and shortness of breath.   Cardiovascular: Negative for chest pain and palpitations.  Gastrointestinal: Negative for abdominal pain, constipation, diarrhea, nausea and vomiting.  Genitourinary: Negative for dysuria and frequency.  Musculoskeletal: Negative for arthralgias, back pain, joint swelling and neck pain.  Skin: Negative for rash.  Neurological: Negative.  Negative for tremors and numbness.  Hematological: Negative for adenopathy. Does not bruise/bleed easily.  Psychiatric/Behavioral: Negative for behavioral problems (Depression), sleep disturbance and suicidal ideas. The patient is not nervous/anxious.      Vital Signs: BP 126/80   Pulse (!) 103   Temp (!) 97.4 F (36.3 C)   Resp 16   Ht 5\' 7"  (1.702 m)   Wt 236 lb (107 kg)   SpO2 98%   BMI 36.96 kg/m    Physical Exam Constitutional:       General: He is not in acute distress.    Appearance: He is well-developed. He is not diaphoretic.  HENT:     Head: Normocephalic and atraumatic.     Mouth/Throat:     Mouth: Mucous membranes are moist.     Pharynx: No oropharyngeal exudate.  Eyes:     Pupils: Pupils are equal, round, and reactive to light.  Neck:     Thyroid: No thyromegaly.     Vascular: No JVD.     Trachea: No tracheal deviation.  Cardiovascular:     Rate and Rhythm: Normal rate and regular rhythm.     Heart sounds: Normal heart sounds. No murmur heard. No friction rub. No gallop.   Pulmonary:     Effort: Pulmonary effort is  normal. No respiratory distress.     Breath sounds: No wheezing or rales.  Chest:     Chest wall: No tenderness.  Abdominal:     General: Bowel sounds are normal.     Palpations: Abdomen is soft.  Musculoskeletal:        General: Normal range of motion.     Cervical back: Normal range of motion and neck supple.  Lymphadenopathy:     Cervical: No cervical adenopathy.  Skin:    General: Skin is warm and dry.  Neurological:     Mental Status: He is alert and oriented to person, place, and time.     Cranial Nerves: No cranial nerve deficit.  Psychiatric:        Behavior: Behavior normal.        Thought Content: Thought content normal.        Judgment: Judgment normal.     Comments: Anxious     Assessment/Plan: 1. Encounter for general adult medical examination with abnormal findings All age appropriate labs are updated, refused colonoscopy, willing to get Cologaurd   2. Mixed hyperlipidemia Continue Lipitor as before - Lipid Panel With LDL/HDL Ratio; Future - T4, free - TSH  3. Benign hypertension Bp is well controlled  - CBC with Differential/Platelet; Future - Comprehensive metabolic panel - CBC with Differential/Platelet  4. Special screening for malignant neoplasm of prostate - PSA  5. Encounter for long-term (current) use of medications Will continue to monitor     6. Generalized anxiety disorder Continue prn use, has OSA, Unable to toelrate CPAP   ALPRAZolam (XANAX) 0.25 MG tablet; Take as needed at night for anxiety  Dispense: 30 tablet; Refill: 2  7. Insomnia, unspecified type Continue as before (AMBIEN) 10 MG tablet; Take 1 tablet (10 mg total) by mouth at bedtime.  Dispense: 30 tablet; Refill: 2  8. Dysuria - UA/M w/rflx Culture, Routine Obesity Counseling:  Risk Assessment: An assessment of behavioral risk factors was made today and includes lack of exercise sedentary lifestyle, lack of portion control and poor dietary habits.  Risk Modification Advice: She was counseled on portion control guidelines. Restricting daily caloric intake to1500 The detrimental long term effects of obesity on her health and ongoing poor compliance was also discussed with the patient.   General Counseling: English verbalizes understanding of the findings of todays visit and agrees with plan of treatment. I have discussed any further diagnostic evaluation that may be needed or ordered today. We also reviewed his medications today. he has been encouraged to call the office with any questions or concerns that should arise related to todays visit.   [place] Controlled Substance Database was reviewed by me.  Orders Placed This Encounter  Procedures  . Microscopic Examination  . UA/M w/rflx Culture, Routine  . PSA  . T4, free  . TSH  . Lipid Panel With LDL/HDL Ratio  . CBC with Differential/Platelet  . Comprehensive metabolic panel  . POCT Urine Drug Screen    Total time spent:30 Minutes  Time spent includes review of chart, medications, test results, and follow up plan with the patient.     Lavera Guise, MD  Internal Medicine

## 2021-02-28 ENCOUNTER — Telehealth: Payer: Self-pay

## 2021-02-28 LAB — COMPREHENSIVE METABOLIC PANEL
ALT: 22 IU/L (ref 0–44)
AST: 21 IU/L (ref 0–40)
Albumin/Globulin Ratio: 1.4 (ref 1.2–2.2)
Albumin: 5 g/dL — ABNORMAL HIGH (ref 3.8–4.9)
Alkaline Phosphatase: 70 IU/L (ref 44–121)
BUN/Creatinine Ratio: 11 (ref 10–24)
BUN: 12 mg/dL (ref 8–27)
Bilirubin Total: 0.4 mg/dL (ref 0.0–1.2)
CO2: 19 mmol/L — ABNORMAL LOW (ref 20–29)
Calcium: 9.8 mg/dL (ref 8.6–10.2)
Chloride: 97 mmol/L (ref 96–106)
Creatinine, Ser: 1.12 mg/dL (ref 0.76–1.27)
Globulin, Total: 3.6 g/dL (ref 1.5–4.5)
Glucose: 108 mg/dL — ABNORMAL HIGH (ref 65–99)
Potassium: 4.1 mmol/L (ref 3.5–5.2)
Sodium: 136 mmol/L (ref 134–144)
Total Protein: 8.6 g/dL — ABNORMAL HIGH (ref 6.0–8.5)
eGFR: 75 mL/min/{1.73_m2} (ref >59)

## 2021-02-28 LAB — CBC WITH DIFFERENTIAL/PLATELET
Basophils Absolute: 0.1 10*3/uL (ref 0.0–0.2)
Basos: 1 %
EOS (ABSOLUTE): 0.1 10*3/uL (ref 0.0–0.4)
Eos: 1 %
Hematocrit: 47.5 % (ref 37.5–51.0)
Hemoglobin: 16.4 g/dL (ref 13.0–17.7)
Immature Grans (Abs): 0.1 10*3/uL (ref 0.0–0.1)
Immature Granulocytes: 1 %
Lymphocytes Absolute: 1.9 10*3/uL (ref 0.7–3.1)
Lymphs: 17 %
MCH: 30.9 pg (ref 26.6–33.0)
MCHC: 34.5 g/dL (ref 31.5–35.7)
MCV: 90 fL (ref 79–97)
Monocytes Absolute: 0.7 10*3/uL (ref 0.1–0.9)
Monocytes: 7 %
Neutrophils Absolute: 8.2 10*3/uL — ABNORMAL HIGH (ref 1.4–7.0)
Neutrophils: 73 %
Platelets: 339 10*3/uL (ref 150–450)
RBC: 5.3 x10E6/uL (ref 4.14–5.80)
RDW: 12.6 % (ref 11.6–15.4)
WBC: 11 10*3/uL — ABNORMAL HIGH (ref 3.4–10.8)

## 2021-02-28 LAB — UA/M W/RFLX CULTURE, ROUTINE
Bilirubin, UA: NEGATIVE
Glucose, UA: NEGATIVE
Ketones, UA: NEGATIVE
Leukocytes,UA: NEGATIVE
Nitrite, UA: NEGATIVE
Protein,UA: NEGATIVE
RBC, UA: NEGATIVE
Specific Gravity, UA: 1.009 (ref 1.005–1.030)
Urobilinogen, Ur: 0.2 mg/dL (ref 0.2–1.0)
pH, UA: 6.5 (ref 5.0–7.5)

## 2021-02-28 LAB — MICROSCOPIC EXAMINATION
Bacteria, UA: NONE SEEN
Casts: NONE SEEN /lpf
Epithelial Cells (non renal): NONE SEEN /hpf (ref 0–10)
RBC, Urine: NONE SEEN /hpf (ref 0–2)
WBC, UA: NONE SEEN /hpf (ref 0–5)

## 2021-02-28 LAB — LIPID PANEL WITH LDL/HDL RATIO
Cholesterol, Total: 218 mg/dL — ABNORMAL HIGH (ref 100–199)
HDL: 36 mg/dL — ABNORMAL LOW (ref >39)
LDL Chol Calc (NIH): 128 mg/dL — ABNORMAL HIGH (ref 0–99)
LDL/HDL Ratio: 3.6 ratio (ref 0.0–3.6)
Triglycerides: 301 mg/dL — ABNORMAL HIGH (ref 0–149)
VLDL Cholesterol Cal: 54 mg/dL — ABNORMAL HIGH (ref 5–40)

## 2021-02-28 LAB — PSA: Prostate Specific Ag, Serum: 0.8 ng/mL (ref 0.0–4.0)

## 2021-02-28 LAB — T4, FREE: Free T4: 1.41 ng/dL (ref 0.82–1.77)

## 2021-02-28 LAB — TSH: TSH: 1.3 u[IU]/mL (ref 0.450–4.500)

## 2021-02-28 NOTE — Telephone Encounter (Signed)
Faxed cologaurd

## 2021-03-07 ENCOUNTER — Other Ambulatory Visit: Payer: Self-pay | Admitting: Nurse Practitioner

## 2021-03-07 ENCOUNTER — Other Ambulatory Visit: Payer: Self-pay | Admitting: Internal Medicine

## 2021-03-07 DIAGNOSIS — F411 Generalized anxiety disorder: Secondary | ICD-10-CM

## 2021-03-07 DIAGNOSIS — Z79899 Other long term (current) drug therapy: Secondary | ICD-10-CM

## 2021-03-07 DIAGNOSIS — G4700 Insomnia, unspecified: Secondary | ICD-10-CM

## 2021-03-08 ENCOUNTER — Other Ambulatory Visit: Payer: Self-pay | Admitting: Internal Medicine

## 2021-03-08 DIAGNOSIS — G4700 Insomnia, unspecified: Secondary | ICD-10-CM

## 2021-03-08 DIAGNOSIS — F411 Generalized anxiety disorder: Secondary | ICD-10-CM

## 2021-03-08 DIAGNOSIS — Z79899 Other long term (current) drug therapy: Secondary | ICD-10-CM

## 2021-03-08 NOTE — Telephone Encounter (Signed)
Can you look into this?

## 2021-03-13 ENCOUNTER — Other Ambulatory Visit: Payer: Self-pay

## 2021-03-13 ENCOUNTER — Telehealth: Payer: Self-pay

## 2021-03-13 DIAGNOSIS — G4700 Insomnia, unspecified: Secondary | ICD-10-CM

## 2021-03-13 DIAGNOSIS — Z79899 Other long term (current) drug therapy: Secondary | ICD-10-CM

## 2021-03-13 DIAGNOSIS — F411 Generalized anxiety disorder: Secondary | ICD-10-CM

## 2021-03-13 MED ORDER — ALPRAZOLAM 0.25 MG PO TABS: ORAL_TABLET | ORAL | 2 refills | Status: DC

## 2021-03-13 MED ORDER — ZOLPIDEM TARTRATE 10 MG PO TABS: 10.0000 mg | ORAL_TABLET | Freq: Every day | ORAL | 2 refills | Status: DC

## 2021-03-13 NOTE — Telephone Encounter (Signed)
As per dr Humphrey Rolls called in [place] for xanax and [place]

## 2021-03-31 LAB — COLOGUARD

## 2021-04-03 ENCOUNTER — Other Ambulatory Visit: Payer: Self-pay | Admitting: Internal Medicine

## 2021-04-03 ENCOUNTER — Other Ambulatory Visit: Payer: Self-pay | Admitting: Nurse Practitioner

## 2021-04-03 DIAGNOSIS — F323 Major depressive disorder, single episode, severe with psychotic features: Secondary | ICD-10-CM

## 2021-04-03 DIAGNOSIS — E782 Mixed hyperlipidemia: Secondary | ICD-10-CM

## 2021-05-04 ENCOUNTER — Telehealth: Payer: Self-pay

## 2021-05-04 NOTE — Telephone Encounter (Signed)
Pt called and states that he took citalopram this morning and later his Lisinopril-HCTZ, and amlodipine and pt said he checked his BP an was 173/78 P= 86.  Pt feels wired.   I talked to [place] about pt's symptoms and she advised that pt should come in to be seen.  I asked if pt could come in for an appt on 05/05/21 but pt can't come due to transportation issues.  So I informed pt that if he felt if his symptoms increased he should call 911.  I also explained how pt should check his Bp.

## 2021-05-25 ENCOUNTER — Other Ambulatory Visit: Payer: Self-pay

## 2021-05-25 ENCOUNTER — Encounter: Payer: Self-pay | Admitting: Nurse Practitioner

## 2021-05-25 ENCOUNTER — Ambulatory Visit: Payer: Medicaid Other | Admitting: Nurse Practitioner

## 2021-05-25 DIAGNOSIS — F411 Generalized anxiety disorder: Secondary | ICD-10-CM

## 2021-05-25 DIAGNOSIS — I10 Essential (primary) hypertension: Secondary | ICD-10-CM

## 2021-05-25 DIAGNOSIS — R0602 Shortness of breath: Secondary | ICD-10-CM

## 2021-05-25 DIAGNOSIS — E782 Mixed hyperlipidemia: Secondary | ICD-10-CM

## 2021-05-25 DIAGNOSIS — F17210 Nicotine dependence, cigarettes, uncomplicated: Secondary | ICD-10-CM

## 2021-05-25 DIAGNOSIS — Z79899 Other long term (current) drug therapy: Secondary | ICD-10-CM

## 2021-05-25 DIAGNOSIS — F331 Major depressive disorder, recurrent, moderate: Secondary | ICD-10-CM

## 2021-05-25 DIAGNOSIS — Z122 Encounter for screening for malignant neoplasm of respiratory organs: Secondary | ICD-10-CM

## 2021-05-25 DIAGNOSIS — Z9989 Dependence on other enabling machines and devices: Secondary | ICD-10-CM

## 2021-05-25 DIAGNOSIS — G4700 Insomnia, unspecified: Secondary | ICD-10-CM

## 2021-05-25 DIAGNOSIS — G4733 Obstructive sleep apnea (adult) (pediatric): Secondary | ICD-10-CM

## 2021-05-25 DIAGNOSIS — F323 Major depressive disorder, single episode, severe with psychotic features: Secondary | ICD-10-CM

## 2021-05-25 NOTE — Progress Notes (Signed)
[facility] [place], [place] [address]  Internal MEDICINE  Office Visit Note  Patient Name: Dustin Heath  [id number]  [id number]  Date of Service: 05/28/2021  Chief Complaint  Patient presents with  . Results    Review labs, cologaurd  . bites    On left lower groin area, not sure if fleas or what they are, got a new mattress and has had issues since then    HPI Dustin Heath presents for follow up visit to discuss smoking cessation, test results and medication refill. He recently did the cologard stool DNA test which was negative. He had labs drawn in February 2022. Discussed lab results with patient: CBC wnl, TSH/T4 wnl, PSA wnl. CMP shows slightly elevated glucose 108.  -hyperlipidemia: abnormal. He is current taking atorvastatin 10 mg which is not the optimal recommended therapy for his ASCVD risk.  Lipid Panel     Component Value Date/Time   CHOL 218 (H) 02/27/2021 1350   TRIG 301 (H) 02/27/2021 1350   HDL 36 (L) 02/27/2021 1350   LDLCALC 128 (H) 02/27/2021 1350   LABVLDL 54 (H) 02/27/2021 1350   -history of hypertension, taking amlodipine 5 mg twice daily and lisinopril-hydrochlorothiazide. Blood pressure not well controlled, 152/92 today.  -takes ambien for insomnia, needs refill.  -unable to wear CPAP due nasal surgery, after a nose fracture and it healing incorrectly.    Current Medication: Outpatient Encounter Medications as of 05/25/2021  Medication Sig  . ALPRAZolam (XANAX) 0.25 MG tablet Take as needed at night for anxiety  . amLODipine (NORVASC) 10 MG tablet Take 1 tablet (10 mg total) by mouth daily.  Marland Kitchen aspirin EC 81 MG tablet Take 81 mg by mouth daily.  Marland Kitchen atorvastatin (LIPITOR) 40 MG tablet Take 1 tablet (40 mg total) by mouth daily.  . Cholecalciferol (VITAMIN D-3) 25 MCG (1000 UT) CAPS Take by mouth.  . cyclobenzaprine (FLEXERIL) 5 MG tablet Take 1 tablet (5 mg total) by mouth at bedtime as needed for muscle spasms.  . fluticasone (FLONASE)  50 MCG/ACT nasal spray Place into both nostrils daily.  . Garlic 2025 MG CAPS Take by mouth.  Marland Kitchen lisinopril-hydrochlorothiazide (ZESTORETIC) 20-12.5 MG tablet Take 2 tablets by mouth daily.  Marland Kitchen lisinopril-hydrochlorothiazide (ZESTORETIC) 20-25 MG tablet Take 1.5 tablets by mouth every morning.  . Magnesium 250 MG TABS Take by mouth.  . Multiple Vitamins-Minerals (CENTRUM SILVER 50+MEN) TABS Take by mouth.  . nicotine polacrilex (COMMIT) 4 MG lozenge Take 1 lozenge (4 mg total) by mouth as needed for smoking cessation.  . vitamin E 180 MG (400 UNITS) capsule Take 400 Units by mouth daily.  Marland Kitchen zolpidem (AMBIEN) 10 MG tablet Take 1 tablet (10 mg total) by mouth at bedtime.  . [DISCONTINUED] amLODipine (NORVASC) 5 MG tablet Take 1 tablet (5 mg total) by mouth in the morning and at bedtime. Take 1 tablet po BID  . [DISCONTINUED] atorvastatin (LIPITOR) 10 MG tablet TAKE 1 TABLET (10 MG TOTAL) BY MOUTH EVERY EVENING.  . citalopram (CELEXA) 40 MG tablet Take 1 tablet by mouth daily for generalized anxiety  . [DISCONTINUED] citalopram (CELEXA) 40 MG tablet TAKE ONE TAB BY MOUTH DAILY FOR GENERALIZED ANXIETY DISORDER   No facility-administered encounter medications on file as of 05/25/2021.    Surgical History: Past Surgical History:  Procedure Laterality Date  . APPENDECTOMY  61 yrs old  . CATARACT EXTRACTION W/PHACO Right 05/11/2020   Procedure: CATARACT EXTRACTION PHACO AND INTRAOCULAR LENS PLACEMENT (IOC) RIGHT 4.59 00:40.1  11.4%;  Surgeon: Leandrew Koyanagi, MD;  Location: [place];  Service: Ophthalmology;  Laterality: Right;  . CATARACT EXTRACTION W/PHACO Left 06/08/2020   Procedure: CATARACT EXTRACTION PHACO AND INTRAOCULAR LENS PLACEMENT (IOC) LEFT WITH ANTERIOR VITRECTOMY;  Surgeon: Leandrew Koyanagi, MD;  Location: [place];  Service: Ophthalmology;  Laterality: Left;  4.47 0:35.8 12.4%  . NOSE SURGERY  2000   Septorhinoplasty    Medical History: Past Medical  History:  Diagnosis Date  . Depression   . Dyspnea    occasional with exertion  . Hyperlipidemia   . Hypertension    controlled on meds  . Insomnia   . Nasal obstruction    left side nasal-severe restriction of air flow  . Orthopnea   . Panic disorder   . Sleep apnea    moderate-no CPAP    Family History: Family History  Problem Relation Age of Onset  . Hypertension Mother   . Hypertension Father     Social History   Socioeconomic History  . Marital status: Single    Spouse name: Not on file  . Number of children: Not on file  . Years of education: Not on file  . Highest education level: Not on file  Occupational History  . Not on file  Tobacco Use  . Smoking status: Current Every Day Smoker    Packs/day: 1.00    Years: 40.00    Pack years: 40.00    Types: Cigarettes  . Smokeless tobacco: Never Used  Vaping Use  . Vaping Use: Never used  Substance and Sexual Activity  . Alcohol use: Never  . Drug use: Never  . Sexual activity: Not on file  Other Topics Concern  . Not on file  Social History Narrative  . Not on file   Social Determinants of Health   Financial Resource Strain: Not on file  Food Insecurity: Not on file  Transportation Needs: Not on file  Physical Activity: Not on file  Stress: Not on file  Social Connections: Not on file  Intimate Partner Violence: Not on file      Review of Systems  Constitutional: Negative for chills, fatigue and unexpected weight change.  HENT: Negative for congestion, rhinorrhea, sneezing and sore throat.   Eyes: Negative for redness.  Respiratory: Negative for cough, chest tightness and shortness of breath.   Cardiovascular: Negative for chest pain and palpitations.  Gastrointestinal: Negative for abdominal pain, constipation, diarrhea, nausea and vomiting.  Genitourinary: Negative for dysuria and frequency.  Musculoskeletal: Negative for arthralgias, back pain, joint swelling and neck pain.  Skin: Negative  for rash.  Neurological: Negative.  Negative for tremors and numbness.  Hematological: Negative for adenopathy. Does not bruise/bleed easily.  Psychiatric/Behavioral: Negative for behavioral problems (Depression), sleep disturbance and suicidal ideas. The patient is not nervous/anxious.     Vital Signs: BP (!) 152/92   Pulse 87   Temp 97.9 F (36.6 C)   Resp 16   Ht 5\' 7"  (1.702 m)   Wt 228 lb 4.8 oz (103.6 kg)   SpO2 97%   BMI 35.76 kg/m    Physical Exam Vitals reviewed.  Constitutional:      General: He is not in acute distress.    Appearance: He is well-developed. He is morbidly obese. He is not ill-appearing or diaphoretic.  HENT:     Head: Normocephalic and atraumatic.  Neck:     Thyroid: No thyromegaly.     Vascular: No JVD.     Trachea: No  tracheal deviation.  Cardiovascular:     Rate and Rhythm: Normal rate and regular rhythm.     Heart sounds: Normal heart sounds. No murmur heard. No friction rub. No gallop.   Pulmonary:     Effort: Pulmonary effort is normal. No respiratory distress.     Breath sounds: No wheezing or rales.  Chest:     Chest wall: No tenderness.  Abdominal:     General: Bowel sounds are normal.     Palpations: Abdomen is soft.  Musculoskeletal:        General: Normal range of motion.  Skin:    General: Skin is warm and dry.     Capillary Refill: Capillary refill takes less than 2 seconds.  Neurological:     Mental Status: He is alert and oriented to person, place, and time.     Cranial Nerves: No cranial nerve deficit.  Psychiatric:        Behavior: Behavior normal. Behavior is cooperative.        Thought Content: Thought content normal.        Judgment: Judgment normal.    Assessment/Plan: 1. Essential (primary) hypertension Blood pressure is not well controlled on current medications. Lisinopril-hydrochlorothiazide adjusted to increase his dose of lisinopril to 40 mg but total dose of hydrochlorothiazide stays the same at 25 mg.  Amlodipine total dose remains the same at 10 mg but was was combined into 1 pill instead of taking one in the morning and one at night.  - lisinopril-hydrochlorothiazide (ZESTORETIC) 20-12.5 MG tablet; Take 2 tablets by mouth daily.  Dispense: 180 tablet; Refill: 2 - amLODipine (NORVASC) 10 MG tablet; Take 1 tablet (10 mg total) by mouth daily.  Dispense: 90 tablet; Refill: 0  2. Shortness of breath Patient has been experiencing some shortness of breath intermittently. He is still smoking more than 1 ppd but he wants to quit and wants help. He also is unable to wear his CPAP due to improper healing of a fractured nose after surgical repair. PFT ordered to determine current status/baseline. Will need to order another sleep study but patient discussed seeing a plastic surgeon named Dr. Eldridge Abrahams to repair the area of his nose that makes him unable to wear his CPAP mask.  - Pulmonary Function Test; Future  3. OSA on CPAP Unable to use CPAP machine due to prior fracture of his nose with surgical repair. He states when he wears the mask it compresses the left nostril and he can barely get any air on that size and it makes a loud nose because of that so he cannot use it at night. He mentioned being interested in plastic surgery to repair the area. Recommend call the sleep clinic to make an appointment to discuss the mask issues to see if there are any other options or solutions to help him. He can also discuss whether plastic surgery would help. He may need another sleep study and cpap titration study when he is able to do those.   4. Mixed hyperlipidemia - calculated 10-year ASCVD risk of 27.8%. He is not on an adequate dose of atorvastatin that satisfies the recommendations for his current ASCVD risk. High intensity dose of atorvastatin should be 40-80 mg. Atorvastatin dose was adjusted. Discussed weight loss, diet modification, smoking cessation, and adequate blood pressure control.  - atorvastatin  (LIPITOR) 40 MG tablet; Take 1 tablet (40 mg total) by mouth daily.  Dispense: 90 tablet; Refill: 3  5. Screening for lung cancer patient is  currently still smoking. He has never had a low dose CT scan of the chest to screen for lung cancer. Discussed the risks and benefits with the patient. He wants to quit smoking and has called the smoking cessation hotline number. He agreed to do the CT scan to screen for lung cancer.  - CT CHEST LUNG CA SCREEN LOW DOSE W/O CM; Future  6. Nicotine dependence, cigarettes, uncomplicated Patient reports that he called the smoking cessation hotline number he found online. They are sending him a package that introduces him to the program and he states that he will receive the nicotine patch and/or nicotine gum through that program. He requested a prescription to try the nicotine lozenges until he receives the gum/patch.  - nicotine polacrilex (COMMIT) 4 MG lozenge; Take 1 lozenge (4 mg total) by mouth as needed for smoking cessation.  Dispense: 100 tablet; Refill: 0  7. Generalized anxiety disorder Patient has a history of anxiety. He is taking alprazolam for anxiety as needed and he reports that this helps.   8. Moderate episode of recurrent major depressive disorder ([place]) He has a history of depression and is taking citalopram which provides adequate control. Refill ordered. - citalopram (CELEXA) 40 MG tablet; Take 1 tablet by mouth daily for generalized anxiety  Dispense: 90 tablet; Refill: 1  9. Insomnia, unspecified type Josemanuel does have difficulty sleeping, he takes [place] for sleep., refill ordered. - zolpidem (AMBIEN) 10 MG tablet; Take 1 tablet (10 mg total) by mouth at bedtime.  Dispense: 30 tablet; Refill: 1   General Counseling: Wenzel verbalizes understanding of the findings of todays visit and agrees with plan of treatment. I have discussed any further diagnostic evaluation that may be needed or ordered today. We also reviewed his medications today.  he has been encouraged to call the office with any questions or concerns that should arise related to todays visit.    Orders Placed This Encounter  Procedures  . CT CHEST LUNG CA SCREEN LOW DOSE W/O CM  . Pulmonary Function Test    Meds ordered this encounter  Medications  . citalopram (CELEXA) 40 MG tablet    Sig: Take 1 tablet by mouth daily for generalized anxiety    Dispense:  90 tablet    Refill:  1  . nicotine polacrilex (COMMIT) 4 MG lozenge    Sig: Take 1 lozenge (4 mg total) by mouth as needed for smoking cessation.    Dispense:  100 tablet    Refill:  0  . atorvastatin (LIPITOR) 40 MG tablet    Sig: Take 1 tablet (40 mg total) by mouth daily.    Dispense:  90 tablet    Refill:  3  . lisinopril-hydrochlorothiazide (ZESTORETIC) 20-12.5 MG tablet    Sig: Take 2 tablets by mouth daily.    Dispense:  180 tablet    Refill:  2  . amLODipine (NORVASC) 10 MG tablet    Sig: Take 1 tablet (10 mg total) by mouth daily.    Dispense:  90 tablet    Refill:  0   Return in 4 weeks (on 06/22/2021) for F/U, Review labs/test, BP check, Sophiya Morello PCP.   Total time spent:30 Minutes Time spent includes review of chart, medications, test results, and follow up plan with the patient.   [place] Controlled Substance Database was reviewed by me.  This patient was seen by Jonetta Osgood, FNP-C in collaboration with Dr. Clayborn Bigness as a part of collaborative care agreement.   Jonetta Osgood,  MSN, FNP-C Internal medicine

## 2021-05-28 DIAGNOSIS — G4733 Obstructive sleep apnea (adult) (pediatric): Secondary | ICD-10-CM | POA: Insufficient documentation

## 2021-05-28 DIAGNOSIS — F331 Major depressive disorder, recurrent, moderate: Secondary | ICD-10-CM | POA: Insufficient documentation

## 2021-05-28 MED ORDER — ATORVASTATIN CALCIUM 40 MG PO TABS
40.0000 mg | ORAL_TABLET | Freq: Every day | ORAL | 3 refills | Status: DC
Start: 2021-05-28 — End: 2022-05-01

## 2021-05-28 MED ORDER — LISINOPRIL-HYDROCHLOROTHIAZIDE 20-12.5 MG PO TABS: 2.0000 | ORAL_TABLET | Freq: Every day | ORAL | 2 refills | Status: DC

## 2021-05-28 MED ORDER — NICOTINE POLACRILEX 4 MG MT LOZG: 4.0000 mg | LOZENGE | OROMUCOSAL | 0 refills | Status: DC | PRN

## 2021-05-28 MED ORDER — AMLODIPINE BESYLATE 10 MG PO TABS: 10.0000 mg | ORAL_TABLET | Freq: Every day | ORAL | 0 refills | Status: DC

## 2021-05-28 MED ORDER — CITALOPRAM HYDROBROMIDE 40 MG PO TABS
ORAL_TABLET | ORAL | 1 refills | Status: DC
Start: 2021-05-28 — End: 2021-09-15

## 2021-05-28 MED ORDER — ZOLPIDEM TARTRATE 10 MG PO TABS
10.0000 mg | ORAL_TABLET | Freq: Every day | ORAL | 1 refills | Status: DC
Start: 2021-05-28 — End: 2021-06-01

## 2021-06-01 ENCOUNTER — Other Ambulatory Visit: Payer: Self-pay | Admitting: Nurse Practitioner

## 2021-06-01 DIAGNOSIS — G4700 Insomnia, unspecified: Secondary | ICD-10-CM

## 2021-06-01 MED ORDER — ZOLPIDEM TARTRATE 10 MG PO TABS: 10.0000 mg | ORAL_TABLET | Freq: Every day | ORAL | 1 refills | Status: DC

## 2021-06-01 MED ORDER — ALPRAZOLAM 0.5 MG PO TABS: 0.5000 mg | ORAL_TABLET | Freq: Every evening | ORAL | 1 refills | Status: DC | PRN

## 2021-06-05 ENCOUNTER — Telehealth: Payer: Self-pay | Admitting: *Deleted

## 2021-06-05 DIAGNOSIS — Z122 Encounter for screening for malignant neoplasm of respiratory organs: Secondary | ICD-10-CM

## 2021-06-05 DIAGNOSIS — Z87891 Personal history of nicotine dependence: Secondary | ICD-10-CM

## 2021-06-05 DIAGNOSIS — F17200 Nicotine dependence, unspecified, uncomplicated: Secondary | ICD-10-CM

## 2021-06-05 NOTE — Telephone Encounter (Signed)
Received referral for initial lung cancer screening scan. Contacted patient and obtained smoking history, current smoker, 1 ppd x 40 yrs, as well as answering questions related to screening process. Patient denies signs of lung cancer such as weight loss or hemoptysis. Patient denies comorbidity that would prevent curative treatment if lung cancer were found. Patient is scheduled for shared decision making visit and CT scan on 06/14/21 @ 10:45 am.

## 2021-06-14 ENCOUNTER — Other Ambulatory Visit: Payer: Self-pay

## 2021-06-14 ENCOUNTER — Ambulatory Visit
Admission: RE | Admit: 2021-06-14 | Discharge: 2021-06-14 | Disposition: A | Discharge status: Home / Self Care | Payer: Medicaid Other | Source: Ambulatory Visit | Attending: Oncology | Admitting: Oncology

## 2021-06-14 ENCOUNTER — Inpatient Hospital Stay: Payer: Medicaid Other | Attending: Oncology | Admitting: Hospice and Palliative Medicine

## 2021-06-14 DIAGNOSIS — F17200 Nicotine dependence, unspecified, uncomplicated: Secondary | ICD-10-CM | POA: Diagnosis present

## 2021-06-14 DIAGNOSIS — Z122 Encounter for screening for malignant neoplasm of respiratory organs: Secondary | ICD-10-CM

## 2021-06-14 DIAGNOSIS — Z87891 Personal history of nicotine dependence: Secondary | ICD-10-CM | POA: Diagnosis present

## 2021-06-14 NOTE — Progress Notes (Signed)
Virtual Visit via Telephone Note  I connected withNAME@ on 06/14/21 [email or web address]@ by telephone and verified that I am speaking with the correct person using two identifiers.   I discussed the limitations of evaluation and management by telemedicine and the availability of in person appointments. The patient expressed understanding and agreed to proceed.  Location: Patient: [facility] Provider: Clinic   In accordance with CMS guidelines, patient has met eligibility criteria including age, absence of signs or symptoms of lung cancer.  Social History   Tobacco Use   Smoking status: Every Day    Packs/day: 1.00    Years: 40.00    Pack years: 40.00    Types: Cigarettes   Smokeless tobacco: Never  Vaping Use   Vaping Use: Never used  Substance Use Topics   Alcohol use: Never   Drug use: Never      A shared decision-making session was conducted prior to the performance of CT scan. This includes one or more decision aids, includes benefits and harms of screening, follow-up diagnostic testing, over-diagnosis, false positive rate, and total radiation exposure.   Counseling on the importance of adherence to annual lung cancer LDCT screening, impact of co-morbidities, and ability or willingness to undergo diagnosis and treatment is imperative for compliance of the program.   Counseling on the importance of continued smoking cessation for former smokers; the importance of smoking cessation for current smokers, and information about tobacco cessation interventions have been given to patient including [place] and [facility] quit [place] programs.   Written order for lung cancer screening with LDCT has been given to the patient and any and all questions have been answered to the best of my abilities.    Yearly follow up will be coordinated by Burgess Estelle, Thoracic Navigator.  Time Total: 5 minutes  Visit consisted of counseling and education dealing with complex health screening. Greater  than 50%  of this time was spent counseling and coordinating care related to the above assessment and plan.  Signed by: Altha Harm, PhD, NP-C

## 2021-06-22 ENCOUNTER — Other Ambulatory Visit: Payer: Self-pay

## 2021-06-22 ENCOUNTER — Encounter: Payer: Self-pay | Admitting: Nurse Practitioner

## 2021-06-22 ENCOUNTER — Ambulatory Visit: Payer: Medicaid Other | Admitting: Nurse Practitioner

## 2021-06-22 VITALS — BP 132/84 | HR 86 | Temp 98.4°F | Resp 16 | Ht 67.0 in | Wt 228.8 lb

## 2021-06-22 DIAGNOSIS — I10 Essential (primary) hypertension: Secondary | ICD-10-CM | POA: Diagnosis not present

## 2021-06-22 DIAGNOSIS — I700 Atherosclerosis of aorta: Secondary | ICD-10-CM | POA: Diagnosis not present

## 2021-06-22 DIAGNOSIS — Z79899 Other long term (current) drug therapy: Secondary | ICD-10-CM | POA: Diagnosis not present

## 2021-06-22 DIAGNOSIS — J432 Centrilobular emphysema: Secondary | ICD-10-CM | POA: Diagnosis not present

## 2021-06-22 DIAGNOSIS — E782 Mixed hyperlipidemia: Secondary | ICD-10-CM

## 2021-06-22 DIAGNOSIS — R918 Other nonspecific abnormal finding of lung field: Secondary | ICD-10-CM | POA: Diagnosis not present

## 2021-06-22 LAB — POCT URINE DRUG SCREEN
Methylenedioxyamphetamine: NOT DETECTED
POC Amphetamine UR: NOT DETECTED
POC BENZODIAZEPINES UR: POSITIVE — AB
POC Barbiturate UR: NOT DETECTED
POC Cocaine UR: NOT DETECTED
POC Ecstasy UR: NOT DETECTED
POC Marijuana UR: NOT DETECTED
POC Methadone UR: NOT DETECTED
POC Methamphetamine UR: NOT DETECTED
POC Opiate Ur: NOT DETECTED
POC Oxycodone UR: NOT DETECTED
POC PHENCYCLIDINE UR: NOT DETECTED
POC TRICYCLICS UR: NOT DETECTED

## 2021-06-22 NOTE — Progress Notes (Signed)
[facility] [place], [place] [address]  Internal MEDICINE  Office Visit Note  Patient Name: Dustin Heath  [id number]  [id number]  Date of Service: 07/01/2021  Chief Complaint  Patient presents with   Follow-up    Review labs tests and BP check, patient stop smoking on 06/20/2021      HPI [name] presents for a follow up visit for hypertension and to discuss his CT chest results. He is still unable to wear his CPAP at night due to the surgical repair to his nose years ago. He is interested in a referral to plastic surgeon Dr. Eldridge Abrahams but there is still no guarantee he can fix the problem and make it possible for the patient to wear the CPAP mask.  -CT chest results:Mild centrilobular emphysema. Pulmonary nodules of maximally volume derived equivalent diameter 5.3 mm. 1. Lung-RADS 2, benign appearance or behavior. Continue annual screening with low-dose chest CT without contrast in 12 months. 2. Aortic Atherosclerosis (ICD10-I70.0) and Emphysema (ICD10-J43.9). Coronary artery atherosclerosis. 3. Borderline prevascular adenopathy, favored to be reactive. Recommend attention on follow-up. These results were discussed with [facility]. He quit smoking 2 days ago and is worried he is following in his mother's footsteps because she was a smoker and then got lung cancer.   -At his previous office visit, his amlodipine was increased to 10mg  and his lisinopril was increased to 40 mg. His blood pressure has significantly improved.    Current Medication: Outpatient Encounter Medications as of 06/22/2021  Medication Sig   ALPRAZolam (XANAX) 0.5 MG tablet Take 1 tablet (0.5 mg total) by mouth at bedtime as needed for anxiety or sleep.   amLODipine (NORVASC) 10 MG tablet Take 1 tablet (10 mg total) by mouth daily.   aspirin EC 81 MG tablet Take 81 mg by mouth daily.   atorvastatin (LIPITOR) 40 MG tablet Take 1 tablet (40 mg total) by mouth daily.   Cholecalciferol (VITAMIN  D-3) 25 MCG (1000 UT) CAPS Take by mouth.   citalopram (CELEXA) 40 MG tablet Take 1 tablet by mouth daily for generalized anxiety   cyclobenzaprine (FLEXERIL) 5 MG tablet Take 1 tablet (5 mg total) by mouth at bedtime as needed for muscle spasms.   fluticasone (FLONASE) 50 MCG/ACT nasal spray Place into both nostrils daily.   Garlic 0347 MG CAPS Take by mouth.   lisinopril-hydrochlorothiazide (ZESTORETIC) 20-12.5 MG tablet Take 2 tablets by mouth daily.   Magnesium 250 MG TABS Take by mouth.   Multiple Vitamins-Minerals (CENTRUM SILVER 50+MEN) TABS Take by mouth.   vitamin E 180 MG (400 UNITS) capsule Take 400 Units by mouth daily.   zolpidem (AMBIEN) 10 MG tablet Take 1 tablet (10 mg total) by mouth at bedtime.   [DISCONTINUED] nicotine polacrilex (COMMIT) 4 MG lozenge Take 1 lozenge (4 mg total) by mouth as needed for smoking cessation.   No facility-administered encounter medications on file as of 06/22/2021.    Surgical History: Past Surgical History:  Procedure Laterality Date   APPENDECTOMY  61 yrs old   CATARACT EXTRACTION W/PHACO Right 05/11/2020   Procedure: CATARACT EXTRACTION PHACO AND INTRAOCULAR LENS PLACEMENT (IOC) RIGHT 4.59 00:40.1 11.4%;  Surgeon: Leandrew Koyanagi, MD;  Location: [place];  Service: Ophthalmology;  Laterality: Right;   CATARACT EXTRACTION W/PHACO Left 06/08/2020   Procedure: CATARACT EXTRACTION PHACO AND INTRAOCULAR LENS PLACEMENT (IOC) LEFT WITH ANTERIOR VITRECTOMY;  Surgeon: Leandrew Koyanagi, MD;  Location: [place];  Service: Ophthalmology;  Laterality: Left;  4.47 0:35.8  12.4%   NOSE SURGERY  2000   Septorhinoplasty    Medical History: Past Medical History:  Diagnosis Date   Depression    Dyspnea    occasional with exertion   Hyperlipidemia    Hypertension    controlled on meds   Insomnia    Nasal obstruction    left side nasal-severe restriction of air flow   Orthopnea    Panic disorder    Sleep apnea     moderate-no CPAP    Family History: Family History  Problem Relation Age of Onset   Hypertension Mother    Hypertension Father     Social History   Socioeconomic History   Marital status: Single    Spouse name: Not on file   Number of children: Not on file   Years of education: Not on file   Highest education level: Not on file  Occupational History   Not on file  Tobacco Use   Smoking status: Former    Packs/day: 1.00    Years: 40.00    Pack years: 40.00    Types: Cigarettes    Quit date: 06/20/2021    Years since quitting: 0.0   Smokeless tobacco: Never  Vaping Use   Vaping Use: Never used  Substance and Sexual Activity   Alcohol use: Never   Drug use: Never   Sexual activity: Not on file  Other Topics Concern   Not on file  Social History Narrative   Not on file   Social Determinants of Health   Financial Resource Strain: Not on file  Food Insecurity: Not on file  Transportation Needs: Not on file  Physical Activity: Not on file  Stress: Not on file  Social Connections: Not on file  Intimate Partner Violence: Not on file      Review of Systems  Constitutional:  Negative for chills, fatigue and unexpected weight change.  HENT:  Negative for congestion, rhinorrhea, sneezing and sore throat.   Eyes:  Negative for redness.  Respiratory:  Negative for cough, chest tightness and shortness of breath.   Cardiovascular:  Negative for chest pain and palpitations.  Gastrointestinal:  Negative for abdominal pain, constipation, diarrhea, nausea and vomiting.  Genitourinary:  Negative for dysuria and frequency.  Musculoskeletal:  Negative for arthralgias, back pain, joint swelling and neck pain.  Skin:  Negative for rash.  Neurological: Negative.  Negative for tremors and numbness.  Hematological:  Negative for adenopathy. Does not bruise/bleed easily.  Psychiatric/Behavioral:  Negative for behavioral problems (Depression), sleep disturbance and suicidal ideas.  The patient is not nervous/anxious.    Vital Signs: BP 132/84   Pulse 86   Temp 98.4 F (36.9 C)   Resp 16   Ht 5\' 7"  (1.702 m)   Wt 228 lb 12.8 oz (103.8 kg)   SpO2 95%   BMI 35.84 kg/m    Physical Exam Vitals reviewed.  Constitutional:      General: He is not in acute distress.    Appearance: Normal appearance. He is not ill-appearing.  Neck:     Vascular: No carotid bruit.  Cardiovascular:     Rate and Rhythm: Normal rate and regular rhythm.     Pulses: Normal pulses.     Heart sounds: Normal heart sounds. No murmur heard. Pulmonary:     Effort: Pulmonary effort is normal. No respiratory distress.     Breath sounds: Normal breath sounds.  Lymphadenopathy:     Cervical: No cervical adenopathy.  Skin:  General: Skin is warm and dry.     Capillary Refill: Capillary refill takes less than 2 seconds.  Neurological:     Mental Status: He is alert and oriented to person, place, and time.  Psychiatric:        Mood and Affect: Mood normal.        Behavior: Behavior normal.    Assessment/Plan: 1. Multiple pulmonary nodules determined by computed tomography of lung Discussed CT results with patient, he quit smoking 2 days ago, repeat CT chest in 1 year with attention to previously identified nodules and reactive prevascular adenopathy.   2. Centrilobular emphysema ([place]) Identified on CT chest, described as mild, Dreshawn quit smoking on 06/20/21. Continue to monitor respiratory status.   3. Aortic atherosclerosis ([place]) Identified on CT chest. No baseline echo or carotid ultrasound available. Echo and ultrasound ordered to establish baseline and periodically monitor progression. Taking atorvastatin 40 mg daily.  - ECHOCARDIOGRAM COMPLETE; Future - US Carotid Duplex Bilateral; Future  4. Essential (primary) hypertension Significantly improved with medication adjustments made during his previous office visit. No changes were made during this visit, continue medications as  prescribed.   5. Mixed hyperlipidemia Atorvastatin was increased due to high estimated 10-year ASCVD risk. Tolerating increased dose with no issues.   6. Encounter for long-term (current) use of medications Urine drug screen positive for benzodiazeprines which is consistent with his current prescriptions.  - POCT Urine Drug Screen   General Counseling: Nicholes verbalizes understanding of the findings of todays visit and agrees with plan of treatment. I have discussed any further diagnostic evaluation that may be needed or ordered today. We also reviewed his medications today. he has been encouraged to call the office with any questions or concerns that should arise related to todays visit.    Orders Placed This Encounter  Procedures   US Carotid Duplex Bilateral   POCT Urine Drug Screen   ECHOCARDIOGRAM COMPLETE    No orders of the defined types were placed in this encounter.   Return in about 3 months (around 09/22/2021) for F/U, anxiety med refill, BP check, Gery Sabedra PCP.   Total time spent:30 Minutes Time spent includes review of chart, medications, test results, and follow up plan with the patient.   [place] Controlled Substance Database was reviewed by me.  This patient was seen by Jonetta Osgood, FNP-C in collaboration with Dr. Clayborn Bigness as a part of collaborative care agreement.   Mkenzie Dotts R. Valetta Fuller, MSN, FNP-C Internal medicine

## 2021-06-26 ENCOUNTER — Encounter: Payer: Self-pay | Admitting: *Deleted

## 2021-06-28 ENCOUNTER — Other Ambulatory Visit: Payer: Self-pay | Admitting: Internal Medicine

## 2021-06-28 DIAGNOSIS — F17210 Nicotine dependence, cigarettes, uncomplicated: Secondary | ICD-10-CM

## 2021-07-24 ENCOUNTER — Other Ambulatory Visit: Payer: Self-pay | Admitting: Internal Medicine

## 2021-07-24 DIAGNOSIS — G4700 Insomnia, unspecified: Secondary | ICD-10-CM

## 2021-07-24 NOTE — Telephone Encounter (Signed)
Please review and fill if appropriate LOV: 06/22/21 NOV: 09/20/21

## 2021-07-25 ENCOUNTER — Telehealth: Payer: Self-pay

## 2021-07-25 NOTE — Telephone Encounter (Signed)
Patient called stating he needs ambien refill sent into [facility]

## 2021-07-26 NOTE — Telephone Encounter (Signed)
Med sent to pharmacy.

## 2021-07-27 ENCOUNTER — Telehealth: Payer: Self-pay

## 2021-07-27 NOTE — Telephone Encounter (Signed)
Patient called regarding referral to dermatologist. I sent message to [facility]

## 2021-08-23 ENCOUNTER — Other Ambulatory Visit: Payer: Self-pay | Admitting: Internal Medicine

## 2021-08-23 DIAGNOSIS — I10 Essential (primary) hypertension: Secondary | ICD-10-CM

## 2021-08-23 DIAGNOSIS — F17210 Nicotine dependence, cigarettes, uncomplicated: Secondary | ICD-10-CM

## 2021-08-23 NOTE — Telephone Encounter (Signed)
Can you please review and send  

## 2021-08-24 ENCOUNTER — Telehealth: Payer: Self-pay

## 2021-09-01 NOTE — Telephone Encounter (Signed)
error 

## 2021-09-15 ENCOUNTER — Other Ambulatory Visit: Payer: Self-pay | Admitting: Internal Medicine

## 2021-09-15 DIAGNOSIS — F331 Major depressive disorder, recurrent, moderate: Secondary | ICD-10-CM

## 2021-09-20 ENCOUNTER — Other Ambulatory Visit: Payer: Self-pay

## 2021-09-20 ENCOUNTER — Encounter: Payer: Self-pay | Admitting: Nurse Practitioner

## 2021-09-20 ENCOUNTER — Ambulatory Visit: Payer: Medicaid Other | Admitting: Nurse Practitioner

## 2021-09-20 ENCOUNTER — Other Ambulatory Visit: Payer: Self-pay | Admitting: Internal Medicine

## 2021-09-20 VITALS — BP 130/80 | HR 82 | Temp 98.4°F | Resp 16 | Ht 67.0 in | Wt 230.4 lb

## 2021-09-20 DIAGNOSIS — I10 Essential (primary) hypertension: Secondary | ICD-10-CM | POA: Diagnosis not present

## 2021-09-20 DIAGNOSIS — I700 Atherosclerosis of aorta: Secondary | ICD-10-CM

## 2021-09-20 DIAGNOSIS — G4700 Insomnia, unspecified: Secondary | ICD-10-CM

## 2021-09-20 DIAGNOSIS — F411 Generalized anxiety disorder: Secondary | ICD-10-CM

## 2021-09-20 DIAGNOSIS — Z6836 Body mass index (BMI) 36.0-36.9, adult: Secondary | ICD-10-CM

## 2021-09-20 DIAGNOSIS — J432 Centrilobular emphysema: Secondary | ICD-10-CM | POA: Diagnosis not present

## 2021-09-20 DIAGNOSIS — Z9989 Dependence on other enabling machines and devices: Secondary | ICD-10-CM

## 2021-09-20 DIAGNOSIS — M545 Low back pain, unspecified: Secondary | ICD-10-CM

## 2021-09-20 DIAGNOSIS — F17210 Nicotine dependence, cigarettes, uncomplicated: Secondary | ICD-10-CM

## 2021-09-20 DIAGNOSIS — R0602 Shortness of breath: Secondary | ICD-10-CM

## 2021-09-20 DIAGNOSIS — G4733 Obstructive sleep apnea (adult) (pediatric): Secondary | ICD-10-CM | POA: Diagnosis not present

## 2021-09-20 DIAGNOSIS — F5104 Psychophysiologic insomnia: Secondary | ICD-10-CM

## 2021-09-20 MED ORDER — ALPRAZOLAM 0.5 MG PO TABS: 0.5000 mg | ORAL_TABLET | Freq: Every evening | ORAL | 2 refills | Status: DC | PRN

## 2021-09-20 MED ORDER — CYCLOBENZAPRINE HCL 5 MG PO TABS: 5.0000 mg | ORAL_TABLET | Freq: Every evening | ORAL | 1 refills | Status: DC | PRN

## 2021-09-20 MED ORDER — ZOLPIDEM TARTRATE 10 MG PO TABS: 10.0000 mg | ORAL_TABLET | Freq: Every day | ORAL | 2 refills | Status: DC

## 2021-09-20 MED ORDER — AMLODIPINE BESYLATE 10 MG PO TABS: 10.0000 mg | ORAL_TABLET | Freq: Every day | ORAL | 1 refills | Status: DC

## 2021-09-20 MED ORDER — NICOTINE 14 MG/24HR TD PT24: 14.0000 mg | MEDICATED_PATCH | Freq: Every day | TRANSDERMAL | 3 refills | Status: DC

## 2021-09-20 NOTE — Progress Notes (Signed)
[facility] [place], [place] [address]  Internal MEDICINE  Office Visit Note  Patient Name: Dustin Heath  [id number]  [id number]  Date of Service: 09/20/2021  Chief Complaint  Patient presents with   Follow-up    refills   Depression   Sleep Apnea   Hyperlipidemia   Hypertension   Anxiety   Quality Metric Gaps    Discuss tdap    HPI Dustin Heath presents for a foll up visit for hypertension, and COPD. He still has his CPAP but he cannot use the CPAP due to his nasal surgery. He is considering having corrective surgery so he can use his CPAP. His echocardiogram and carotid ultrasound were ordered in July but have not been done yet. He needs a PFT ordered. He has initially stopped smoking after discussing his CT chest that showed lung nodules growing in size. He is smoking again less than 1 ppd and is still working on quitting. He is requesting refills as well.   Current Medication: Outpatient Encounter Medications as of 09/20/2021  Medication Sig   aspirin EC 81 MG tablet Take 81 mg by mouth daily.   atorvastatin (LIPITOR) 40 MG tablet Take 1 tablet (40 mg total) by mouth daily.   Cholecalciferol (VITAMIN D-3) 25 MCG (1000 UT) CAPS Take by mouth.   citalopram (CELEXA) 40 MG tablet TAKE ONE TAB BY MOUTH DAILY FOR GENERALIZED ANXIETY DISORDER   fluticasone (FLONASE) 50 MCG/ACT nasal spray Place into both nostrils daily.   Garlic 2595 MG CAPS Take by mouth.   lisinopril-hydrochlorothiazide (ZESTORETIC) 20-12.5 MG tablet Take 2 tablets by mouth daily.   Magnesium 250 MG TABS Take by mouth.   Multiple Vitamins-Minerals (CENTRUM SILVER 50+MEN) TABS Take by mouth.   nicotine (NICODERM CQ - DOSED IN MG/24 HOURS) 14 mg/24hr patch Place 1 patch (14 mg total) onto the skin daily.   nicotine polacrilex (NICORETTE) 4 MG gum TAKE 1 LOZENGE (4 MG TOTAL) BY MOUTH AS NEEDED FOR SMOKING CESSATION.   vitamin E 180 MG (400 UNITS) capsule Take 400 Units by mouth daily.    [DISCONTINUED] ALPRAZolam (XANAX) 0.5 MG tablet TAKE 1 TABLET BY MOUTH AT BEDTIME AS NEEDED ANXIETY OR SLEEP   [DISCONTINUED] amLODipine (NORVASC) 10 MG tablet TAKE 1 TABLET (10 MG TOTAL) BY MOUTH DAILY.   [DISCONTINUED] cyclobenzaprine (FLEXERIL) 5 MG tablet Take 1 tablet (5 mg total) by mouth at bedtime as needed for muscle spasms.   [DISCONTINUED] zolpidem (AMBIEN) 10 MG tablet TAKE 1 TABLET (10 MG TOTAL) BY MOUTH AT BEDTIME.   ALPRAZolam (XANAX) 0.5 MG tablet Take 1 tablet (0.5 mg total) by mouth at bedtime as needed for anxiety or sleep.   amLODipine (NORVASC) 10 MG tablet Take 1 tablet (10 mg total) by mouth daily.   cyclobenzaprine (FLEXERIL) 5 MG tablet Take 1 tablet (5 mg total) by mouth at bedtime as needed for muscle spasms.   zolpidem (AMBIEN) 10 MG tablet Take 1 tablet (10 mg total) by mouth at bedtime.   No facility-administered encounter medications on file as of 09/20/2021.    Surgical History: Past Surgical History:  Procedure Laterality Date   APPENDECTOMY  61 yrs old   CATARACT EXTRACTION W/PHACO Right 05/11/2020   Procedure: CATARACT EXTRACTION PHACO AND INTRAOCULAR LENS PLACEMENT (IOC) RIGHT 4.59 00:40.1 11.4%;  Surgeon: Leandrew Koyanagi, MD;  Location: [place];  Service: Ophthalmology;  Laterality: Right;   CATARACT EXTRACTION W/PHACO Left 06/08/2020   Procedure: CATARACT EXTRACTION PHACO AND INTRAOCULAR LENS PLACEMENT (IOC)  LEFT WITH ANTERIOR VITRECTOMY;  Surgeon: Leandrew Koyanagi, MD;  Location: [place];  Service: Ophthalmology;  Laterality: Left;  4.47 0:35.8 12.4%   NOSE SURGERY  2000   Septorhinoplasty    Medical History: Past Medical History:  Diagnosis Date   Depression    Dyspnea    occasional with exertion   Hyperlipidemia    Hypertension    controlled on meds   Insomnia    Nasal obstruction    left side nasal-severe restriction of air flow   Orthopnea    Panic disorder    Sleep apnea    moderate-no CPAP    Family  History: Family History  Problem Relation Age of Onset   Hypertension Mother    Hypertension Father     Social History   Socioeconomic History   Marital status: Single    Spouse name: Not on file   Number of children: Not on file   Years of education: Not on file   Highest education level: Not on file  Occupational History   Not on file  Tobacco Use   Smoking status: Every Day    Packs/day: 1.00    Years: 40.00    Pack years: 40.00    Types: Cigarettes    Last attempt to quit: 06/20/2021    Years since quitting: 0.3   Smokeless tobacco: Never  Vaping Use   Vaping Use: Never used  Substance and Sexual Activity   Alcohol use: Never   Drug use: Never   Sexual activity: Not on file  Other Topics Concern   Not on file  Social History Narrative   Not on file   Social Determinants of Health   Financial Resource Strain: Not on file  Food Insecurity: Not on file  Transportation Needs: Not on file  Physical Activity: Not on file  Stress: Not on file  Social Connections: Not on file  Intimate Partner Violence: Not on file      Review of Systems  Constitutional:  Negative for chills, fatigue and unexpected weight change.  HENT:  Negative for congestion, rhinorrhea, sneezing and sore throat.   Eyes:  Negative for redness.  Respiratory:  Negative for cough, chest tightness and shortness of breath.   Cardiovascular:  Negative for chest pain and palpitations.  Gastrointestinal:  Negative for abdominal pain, constipation, diarrhea, nausea and vomiting.  Genitourinary:  Negative for dysuria and frequency.  Musculoskeletal:  Negative for arthralgias, back pain, joint swelling and neck pain.  Skin:  Negative for rash.  Neurological: Negative.  Negative for tremors and numbness.  Hematological:  Negative for adenopathy. Does not bruise/bleed easily.  Psychiatric/Behavioral:  Negative for behavioral problems (Depression), sleep disturbance and suicidal ideas. The patient is not  nervous/anxious.    Vital Signs: BP 130/80   Pulse 82   Temp 98.4 F (36.9 C)   Resp 16   Ht 5\' 7"  (1.702 m)   Wt 230 lb 6.4 oz (104.5 kg)   SpO2 97%   BMI 36.09 kg/m    Physical Exam Vitals reviewed.  Constitutional:      General: He is not in acute distress.    Appearance: Normal appearance. He is obese. He is not ill-appearing.  HENT:     Head: Normocephalic and atraumatic.  Eyes:     Extraocular Movements: Extraocular movements intact.     Pupils: Pupils are equal, round, and reactive to light.  Cardiovascular:     Rate and Rhythm: Normal rate.  Pulmonary:  Effort: Pulmonary effort is normal. No respiratory distress.  Neurological:     Mental Status: He is alert and oriented to person, place, and time.     Cranial Nerves: No cranial nerve deficit.  Psychiatric:        Mood and Affect: Mood normal.        Behavior: Behavior normal.       Assessment/Plan: 1. Centrilobular emphysema ([place]) Found on CT chest. Had recently quit smoking but started back up again. He c/o intermittent SOB but is not on a maintenance inhaler and does not currently have a prescription for a rescue inhaler. Will get PFTs to assess current respiratory status and need for maintenance treatment.  -Pulmonary function test  2. Aortic atherosclerosis ([facility]) Echocardiogram is scheduled for 10/18/21 and carotid ultrasound is scheduled for 11/01/21. He is currently on atorvastatin 40 mg daily.   3. Essential (primary) hypertension Blood pressure is stable with current medication, amlodipine refill ordered. - amLODipine (NORVASC) 10 MG tablet; Take 1 tablet (10 mg total) by mouth daily.  Dispense: 90 tablet; Refill: 1  4. OSA on CPAP Not using CPAP still due to prior nasal surgery but he is now considering having corrective surgery done so he will be able to use his CPAP.   5. Nicotine dependence, cigarettes, uncomplicated He briefly quit smoking after discussing his CT chest results at his  previous office visit but has started smoking again as a coping mechanism for his anxiety. Nicotine patches prescribed per patient request.  Smoking cessation counseling: Pt acknowledges the risks of long term smoking, He will try to quite smoking. Options for different medications including nicotine products, chewing gum, patch etc, Wellbutrin and Chantix is discussed. Patient chose patches.  Goal and date of compete cessation is discussed. Goal is approximately 2-3 months.   Total time spent in smoking cessation is 10 min. - nicotine (NICODERM CQ - DOSED IN MG/24 HOURS) 14 mg/24hr patch; Place 1 patch (14 mg total) onto the skin daily.  Dispense: 28 patch; Refill: 3  6. Dorsalgia of lumbosacral region He has chronic back pain, muscle relaxant refilled.  - cyclobenzaprine (FLEXERIL) 5 MG tablet; Take 1 tablet (5 mg total) by mouth at bedtime as needed for muscle spasms.  Dispense: 90 tablet; Refill: 1  7. Psychophysiological insomnia Ambien refill ordered, alprazolam refill order - zolpidem (AMBIEN) 10 MG tablet; Take 1 tablet (10 mg total) by mouth at bedtime.  Dispense: 30 tablet; Refill: 2 - ALPRAZolam (XANAX) 0.5 MG tablet; Take 1 tablet (0.5 mg total) by mouth at bedtime as needed for anxiety or sleep.  Dispense: 30 tablet; Refill: 2  8. Generalized anxiety disorder Increased anxiety, he has been coping by smoking cigarettes but wants to quit. Alprazolam refill ordered.  - ALPRAZolam (XANAX) 0.5 MG tablet; Take 1 tablet (0.5 mg total) by mouth at bedtime as needed for anxiety or sleep.  Dispense: 30 tablet; Refill: 2  9. BMI 36.0-36.9,adult Elevated BMI, weight is 230 lbs. Will discuss diet and lifestyle modifications in detail at next office visit.    General Counseling: Cotey verbalizes understanding of the findings of todays visit and agrees with plan of treatment. I have discussed any further diagnostic evaluation that may be needed or ordered today. We also reviewed his  medications today. he has been encouraged to call the office with any questions or concerns that should arise related to todays visit.    No orders of the defined types were placed in this encounter.   Meds  ordered this encounter  Medications   nicotine (NICODERM CQ - DOSED IN MG/24 HOURS) 14 mg/24hr patch    Sig: Place 1 patch (14 mg total) onto the skin daily.    Dispense:  28 patch    Refill:  3   cyclobenzaprine (FLEXERIL) 5 MG tablet    Sig: Take 1 tablet (5 mg total) by mouth at bedtime as needed for muscle spasms.    Dispense:  90 tablet    Refill:  1   zolpidem (AMBIEN) 10 MG tablet    Sig: Take 1 tablet (10 mg total) by mouth at bedtime.    Dispense:  30 tablet    Refill:  2   amLODipine (NORVASC) 10 MG tablet    Sig: Take 1 tablet (10 mg total) by mouth daily.    Dispense:  90 tablet    Refill:  1   ALPRAZolam (XANAX) 0.5 MG tablet    Sig: Take 1 tablet (0.5 mg total) by mouth at bedtime as needed for anxiety or sleep.    Dispense:  30 tablet    Refill:  2    Return in about 3 months (around 12/20/2021) for F/U, med refill, Delonta Yohannes PCP.   Total time spent:30 Minutes Time spent includes review of chart, medications, test results, and follow up plan with the patient.   [place] Controlled Substance Database was reviewed by me.  This patient was seen by Jonetta Osgood, FNP-C in collaboration with Dr. Clayborn Bigness as a part of collaborative care agreement.   Zamarah Ullmer R. Valetta Fuller, MSN, FNP-C Internal medicine

## 2021-09-21 NOTE — Telephone Encounter (Signed)
Refill done during office visit

## 2021-10-04 ENCOUNTER — Other Ambulatory Visit: Payer: Self-pay

## 2021-10-04 ENCOUNTER — Ambulatory Visit: Payer: Medicaid Other | Admitting: Internal Medicine

## 2021-10-04 DIAGNOSIS — R0602 Shortness of breath: Secondary | ICD-10-CM

## 2021-10-04 LAB — PULMONARY FUNCTION TEST

## 2021-10-11 ENCOUNTER — Other Ambulatory Visit: Payer: Medicaid Other

## 2021-10-15 NOTE — Procedures (Signed)
[facility] [address], [address]    Complete Pulmonary Function Testing Interpretation:  FINDINGS:  Forced vital capacity is normal.  FEV1 is normal.  FEV1 FVC ratio was decreased.  Postbronchodilator no significant change in FEV1.  Total lung capacity is mildly decreased.  Residual volume is moderately decreased.  Residual volume total lung capacity ratio is decreased.  FRC is decreased.  DLCO was normal.  IMPRESSION:  This pulmonary function study is suggestive of mild restrictive lung disease the study is somewhat limited by reduced peak expiratory flow rate clinical correlation is recommended  Allyne Gee, MD [facility] Pulmonary Critical Care Medicine Sleep Medicine

## 2021-10-18 ENCOUNTER — Ambulatory Visit: Payer: Medicaid Other

## 2021-10-18 ENCOUNTER — Other Ambulatory Visit: Payer: Self-pay

## 2021-10-18 DIAGNOSIS — I700 Atherosclerosis of aorta: Secondary | ICD-10-CM

## 2021-11-01 ENCOUNTER — Other Ambulatory Visit: Payer: Self-pay

## 2021-11-01 ENCOUNTER — Ambulatory Visit: Payer: Medicaid Other

## 2021-11-01 DIAGNOSIS — I700 Atherosclerosis of aorta: Secondary | ICD-10-CM

## 2021-11-01 DIAGNOSIS — I6529 Occlusion and stenosis of unspecified carotid artery: Secondary | ICD-10-CM

## 2021-11-05 NOTE — Procedures (Signed)
[place], [place] [address]  DATE OF SERVICE: 11/01/2021  CAROTID DOPPLER INTERPRETATION:  Bilateral Carotid Ultrsasound and Color Doppler Examination was performed. The RIGHT CCA shows no significant plaque in the vessel. The LEFT CCA shows no significant plaque in the vessel. There was no significant intimal thickening noted in the RIGHT carotid artery. There was no significant intimal thickening in the LEFT carotid artery.  The RIGHT CCA shows peak systolic velocity of 77 cm per second. The end diastolic velocity is 21 cm per second on the RIGHT side. The RIGHT ICA shows peak systolic velocity of 83 per second. RIGHT sided ICA end diastolic velocity is 37 cm per second. The RIGHT ECA shows a peak systolic velocity of 277 cm per second. The ICA/CCA ratio is calculated to be 1.07. This suggests less than 50% stenosis. The Vertebral Artery shows antegrade flow.  The LEFT CCA shows peak systolic velocity of 70 cm per second. The end diastolic velocity is 16 cm per second on the LEFT side. The LEFT ICA shows peak systolic velocity of 72 per second. LEFT sided ICA end diastolic velocity is 30 cm per second. The LEFT ECA shows a peak systolic velocity of 88 cm per second. The ICA/CCA ratio is calculated to be 1.63. This suggests less than 50% stenosis. The Vertebral Artery shows antegrade flow.   Impression:    The RIGHT CAROTID shows less than 50% stenosis. The LEFT CAROTID shows less than 50% stenosis.  There is no significant plaque formation noted on the LEFT and no significant plaque on the RIGHT  side. Consider a repeat Carotid doppler if clinical situation and symptoms warrant in 6-12 months. Patient should be encouraged to change lifestyles such as smoking cessation, regular exercise and dietary modification. Use of statins in the right clinical setting and ASA is encouraged.  Allyne Gee, MD [facility] Pulmonary Critical Care Medicine

## 2021-12-06 ENCOUNTER — Other Ambulatory Visit: Payer: Self-pay | Admitting: Nurse Practitioner

## 2021-12-06 DIAGNOSIS — F411 Generalized anxiety disorder: Secondary | ICD-10-CM

## 2021-12-06 DIAGNOSIS — F5104 Psychophysiologic insomnia: Secondary | ICD-10-CM

## 2021-12-13 NOTE — Telephone Encounter (Signed)
Med sent to pharmacy.

## 2021-12-20 ENCOUNTER — Encounter: Payer: Self-pay | Admitting: Nurse Practitioner

## 2021-12-20 ENCOUNTER — Ambulatory Visit (INDEPENDENT_AMBULATORY_CARE_PROVIDER_SITE_OTHER): Payer: Medicaid Other | Admitting: Nurse Practitioner

## 2021-12-20 ENCOUNTER — Other Ambulatory Visit: Payer: Self-pay

## 2021-12-20 DIAGNOSIS — I10 Essential (primary) hypertension: Secondary | ICD-10-CM

## 2021-12-20 DIAGNOSIS — F331 Major depressive disorder, recurrent, moderate: Secondary | ICD-10-CM

## 2021-12-20 DIAGNOSIS — F411 Generalized anxiety disorder: Secondary | ICD-10-CM

## 2021-12-20 DIAGNOSIS — F5104 Psychophysiologic insomnia: Secondary | ICD-10-CM | POA: Diagnosis not present

## 2021-12-20 DIAGNOSIS — M545 Low back pain, unspecified: Secondary | ICD-10-CM

## 2021-12-20 MED ORDER — ZOLPIDEM TARTRATE 10 MG PO TABS: 10.0000 mg | ORAL_TABLET | Freq: Every day | ORAL | 2 refills | Status: DC

## 2021-12-20 MED ORDER — CITALOPRAM HYDROBROMIDE 40 MG PO TABS: ORAL_TABLET | ORAL | 1 refills | Status: DC

## 2021-12-20 MED ORDER — AMLODIPINE BESYLATE 10 MG PO TABS: 10.0000 mg | ORAL_TABLET | Freq: Every day | ORAL | 1 refills | Status: DC

## 2021-12-20 MED ORDER — CYCLOBENZAPRINE HCL 5 MG PO TABS: 5.0000 mg | ORAL_TABLET | Freq: Every evening | ORAL | 1 refills | Status: DC | PRN

## 2021-12-20 MED ORDER — ALPRAZOLAM 0.5 MG PO TABS: 0.5000 mg | ORAL_TABLET | Freq: Every evening | ORAL | 2 refills | Status: DC | PRN

## 2021-12-20 NOTE — Progress Notes (Signed)
[facility], [place] [address]  Internal MEDICINE  Office Visit Note  Patient Name: Dustin Heath  [id number]  [id number]  Date of Service: 12/20/2021  Chief Complaint  Patient presents with   Depression   Hyperlipidemia   Hypertension   Anxiety   Results   Follow-up    Discuss dental procedures     HPI Dustin Heath presents for a follow up visit to discuss results of echocardiogram, PFT and carotid ultrasound. He has gained 2 lbs since his previous office visit. His blood pressure is stable. He did previously quit smoking for aboiut 4 days but he is smoking again and still trying to quit but taking it slow since cold [place] was not the right method for him to be successful. His echocardiogram was grossly normal except for some diastolic dysfunction. His PFT results showed mild restrictive lung disease and is stable.  His carotid ultrasound showed less than 50% stenosis and no significant plaque formation bilaterally.     Current Medication: Outpatient Encounter Medications as of 12/20/2021  Medication Sig   aspirin EC 81 MG tablet Take 81 mg by mouth daily.   atorvastatin (LIPITOR) 40 MG tablet Take 1 tablet (40 mg total) by mouth daily.   Cholecalciferol (VITAMIN D-3) 25 MCG (1000 UT) CAPS Take by mouth.   fluticasone (FLONASE) 50 MCG/ACT nasal spray Place into both nostrils daily.   Garlic 2542 MG CAPS Take by mouth.   lisinopril-hydrochlorothiazide (ZESTORETIC) 20-12.5 MG tablet Take 2 tablets by mouth daily.   Magnesium 250 MG TABS Take by mouth.   Multiple Vitamins-Minerals (CENTRUM SILVER 50+MEN) TABS Take by mouth.   nicotine polacrilex (NICORETTE) 4 MG gum TAKE 1 LOZENGE (4 MG TOTAL) BY MOUTH AS NEEDED FOR SMOKING CESSATION.   vitamin E 180 MG (400 UNITS) capsule Take 400 Units by mouth daily.   [DISCONTINUED] ALPRAZolam (XANAX) 0.5 MG tablet TAKE 1 TABLET AT BEDTIME AS NEEDED ANXIETY OR SLEEP   [DISCONTINUED] amLODipine (NORVASC) 10 MG  tablet Take 1 tablet (10 mg total) by mouth daily.   [DISCONTINUED] citalopram (CELEXA) 40 MG tablet TAKE ONE TAB BY MOUTH DAILY FOR GENERALIZED ANXIETY DISORDER   [DISCONTINUED] cyclobenzaprine (FLEXERIL) 5 MG tablet Take 1 tablet (5 mg total) by mouth at bedtime as needed for muscle spasms.   [DISCONTINUED] nicotine (NICODERM CQ - DOSED IN MG/24 HOURS) 14 mg/24hr patch Place 1 patch (14 mg total) onto the skin daily.   [DISCONTINUED] zolpidem (AMBIEN) 10 MG tablet Take 1 tablet (10 mg total) by mouth at bedtime.   ALPRAZolam (XANAX) 0.5 MG tablet Take 1-1.5 tablets (0.5-0.75 mg total) by mouth at bedtime as needed for anxiety or sleep.   amLODipine (NORVASC) 10 MG tablet Take 1 tablet (10 mg total) by mouth daily.   citalopram (CELEXA) 40 MG tablet TAKE ONE TAB BY MOUTH DAILY FOR GENERALIZED ANXIETY DISORDER   cyclobenzaprine (FLEXERIL) 5 MG tablet Take 1 tablet (5 mg total) by mouth at bedtime as needed for muscle spasms.   zolpidem (AMBIEN) 10 MG tablet Take 1 tablet (10 mg total) by mouth at bedtime.   No facility-administered encounter medications on file as of 12/20/2021.    Surgical History: Past Surgical History:  Procedure Laterality Date   APPENDECTOMY  61 yrs old   CATARACT EXTRACTION W/PHACO Right 05/11/2020   Procedure: CATARACT EXTRACTION PHACO AND INTRAOCULAR LENS PLACEMENT (IOC) RIGHT 4.59 00:40.1 11.4%;  Surgeon: Leandrew Koyanagi, MD;  Location: [facility];  Service: Ophthalmology;  Laterality: Right;  CATARACT EXTRACTION W/PHACO Left 06/08/2020   Procedure: CATARACT EXTRACTION PHACO AND INTRAOCULAR LENS PLACEMENT (IOC) LEFT WITH ANTERIOR VITRECTOMY;  Surgeon: Leandrew Koyanagi, MD;  Location: [facility];  Service: Ophthalmology;  Laterality: Left;  4.47 0:35.8 12.4%   NOSE SURGERY  2000   Septorhinoplasty    Medical History: Past Medical History:  Diagnosis Date   Depression    Dyspnea    occasional with exertion   Hyperlipidemia     Hypertension    controlled on meds   Insomnia    Nasal obstruction    left side nasal-severe restriction of air flow   Orthopnea    Panic disorder    Sleep apnea    moderate-no CPAP    Family History: Family History  Problem Relation Age of Onset   Hypertension Mother    Hypertension Father     Social History   Socioeconomic History   Marital status: Single    Spouse name: Not on file   Number of children: Not on file   Years of education: Not on file   Highest education level: Not on file  Occupational History   Not on file  Tobacco Use   Smoking status: Every Day    Packs/day: 1.00    Years: 40.00    Pack years: 40.00    Types: Cigarettes    Last attempt to quit: 06/20/2021    Years since quitting: 0.5   Smokeless tobacco: Never  Vaping Use   Vaping Use: Never used  Substance and Sexual Activity   Alcohol use: Never   Drug use: Never   Sexual activity: Not on file  Other Topics Concern   Not on file  Social History Narrative   Not on file   Social Determinants of Health   Financial Resource Strain: Not on file  Food Insecurity: Not on file  Transportation Needs: Not on file  Physical Activity: Not on file  Stress: Not on file  Social Connections: Not on file  Intimate Partner Violence: Not on file      Review of Systems  Constitutional:  Negative for chills, fatigue and unexpected weight change.  HENT:  Negative for congestion, rhinorrhea, sneezing and sore throat.   Eyes:  Negative for redness.  Respiratory:  Negative for cough, chest tightness and shortness of breath.   Cardiovascular:  Negative for chest pain and palpitations.  Gastrointestinal:  Negative for abdominal pain, constipation, diarrhea, nausea and vomiting.  Genitourinary:  Negative for dysuria and frequency.  Musculoskeletal:  Negative for arthralgias, back pain, joint swelling and neck pain.  Skin:  Negative for rash.  Neurological: Negative.  Negative for tremors and numbness.   Hematological:  Negative for adenopathy. Does not bruise/bleed easily.  Psychiatric/Behavioral:  Negative for behavioral problems (Depression), sleep disturbance and suicidal ideas. The patient is not nervous/anxious.    Vital Signs: BP 122/79    Pulse 92    Temp 98.6 F (37 C)    Resp 16    Ht 5\' 7"  (1.702 m)    Wt 232 lb (105.2 kg)    SpO2 96%    BMI 36.34 kg/m    Physical Exam Vitals reviewed.  Constitutional:      General: He is not in acute distress.    Appearance: Normal appearance. He is obese. He is not ill-appearing.  HENT:     Head: Normocephalic and atraumatic.  Eyes:     Pupils: Pupils are equal, round, and reactive to light.  Cardiovascular:  Rate and Rhythm: Normal rate and regular rhythm.  Pulmonary:     Effort: Pulmonary effort is normal. No respiratory distress.  Neurological:     Mental Status: He is alert and oriented to person, place, and time.     Cranial Nerves: No cranial nerve deficit.     Coordination: Coordination normal.     Gait: Gait normal.  Psychiatric:        Mood and Affect: Mood normal.        Behavior: Behavior normal.       Assessment/Plan: 1. Essential (primary) hypertension B is stable on current medication, refills ordered.  - amLODipine (NORVASC) 10 MG tablet; Take 1 tablet (10 mg total) by mouth daily.  Dispense: 90 tablet; Refill: 1  2. Dorsalgia of lumbosacral region Refills ordered.  - cyclobenzaprine (FLEXERIL) 5 MG tablet; Take 1 tablet (5 mg total) by mouth at bedtime as needed for muscle spasms.  Dispense: 90 tablet; Refill: 1  3. Psychophysiological insomnia Sleeping well with ambien and alprazolam. Refills ordered.  - zolpidem (AMBIEN) 10 MG tablet; Take 1 tablet (10 mg total) by mouth at bedtime.  Dispense: 30 tablet; Refill: 2 - ALPRAZolam (XANAX) 0.5 MG tablet; Take 1-1.5 tablets (0.5-0.75 mg total) by mouth at bedtime as needed for anxiety or sleep.  Dispense: 45 tablet; Refill: 2  4. Moderate episode of  recurrent major depressive disorder (HCC) Refills ordered, stable.  - citalopram (CELEXA) 40 MG tablet; TAKE ONE TAB BY MOUTH DAILY FOR GENERALIZED ANXIETY DISORDER  Dispense: 90 tablet; Refill: 1  5. Generalized anxiety disorder Stable, refills ordered. Also taking citalopram.  - ALPRAZolam (XANAX) 0.5 MG tablet; Take 1-1.5 tablets (0.5-0.75 mg total) by mouth at bedtime as needed for anxiety or sleep.  Dispense: 45 tablet; Refill: 2   General Counseling: Dustin Heath verbalizes understanding of the findings of todays visit and agrees with plan of treatment. I have discussed any further diagnostic evaluation that may be needed or ordered today. We also reviewed his medications today. he has been encouraged to call the office with any questions or concerns that should arise related to todays visit.    No orders of the defined types were placed in this encounter.   Meds ordered this encounter  Medications   amLODipine (NORVASC) 10 MG tablet    Sig: Take 1 tablet (10 mg total) by mouth daily.    Dispense:  90 tablet    Refill:  1   zolpidem (AMBIEN) 10 MG tablet    Sig: Take 1 tablet (10 mg total) by mouth at bedtime.    Dispense:  30 tablet    Refill:  2   cyclobenzaprine (FLEXERIL) 5 MG tablet    Sig: Take 1 tablet (5 mg total) by mouth at bedtime as needed for muscle spasms.    Dispense:  90 tablet    Refill:  1   citalopram (CELEXA) 40 MG tablet    Sig: TAKE ONE TAB BY MOUTH DAILY FOR GENERALIZED ANXIETY DISORDER    Dispense:  90 tablet    Refill:  1    Maximum Refills Reached   ALPRAZolam (XANAX) 0.5 MG tablet    Sig: Take 1-1.5 tablets (0.5-0.75 mg total) by mouth at bedtime as needed for anxiety or sleep.    Dispense:  45 tablet    Refill:  2    Maximum Refills Reached    Return in about 3 months (around 03/20/2022) for F/U, anxiety med refill, [facility]cci PCP.   Total time spent:30  Minutes Time spent includes review of chart, medications, test results, and follow up plan with  the patient.   [place] Controlled Substance Database was reviewed by me.  This patient was seen by Jonetta Osgood, FNP-C in collaboration with Dr. Clayborn Bigness as a part of collaborative care agreement.   Raja Liska R. Valetta Fuller, MSN, FNP-C Internal medicine

## 2021-12-27 ENCOUNTER — Other Ambulatory Visit: Payer: Self-pay | Admitting: Nurse Practitioner

## 2021-12-27 DIAGNOSIS — F17210 Nicotine dependence, cigarettes, uncomplicated: Secondary | ICD-10-CM

## 2022-01-21 ENCOUNTER — Encounter: Payer: Self-pay | Admitting: Nurse Practitioner

## 2022-01-23 IMAGING — CT CT CHEST LUNG CANCER SCREENING LOW DOSE W/O CM
2 of 5 series · 15 of 40 positions shown, 18 images · non-contrast
Comparison: None.

CLINICAL DATA: Forty  pack-year smoking history.  Current smoker.

EXAM:
CT CHEST WITHOUT CONTRAST LOW-DOSE FOR LUNG CANCER SCREENING
TECHNIQUE: Multidetector CT imaging of the chest was performed following the
standard protocol without IV contrast.

[Series 3: lung 1.00 · axial · 0.76mm/px · z∈[-1248,-925]mm · 12 of 357 slices shown, 15 images]
[im 17/357  mediastinal]
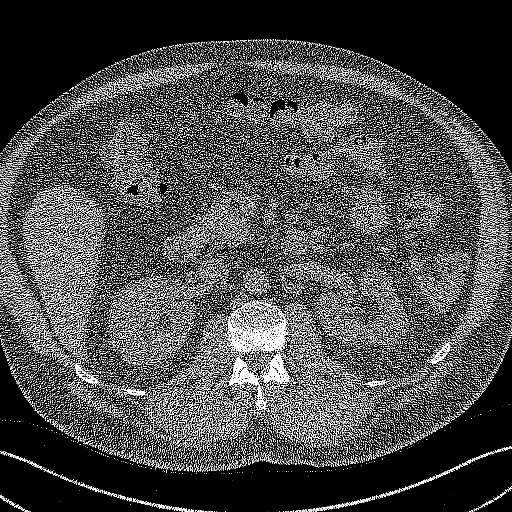
[im 17/357  lung]
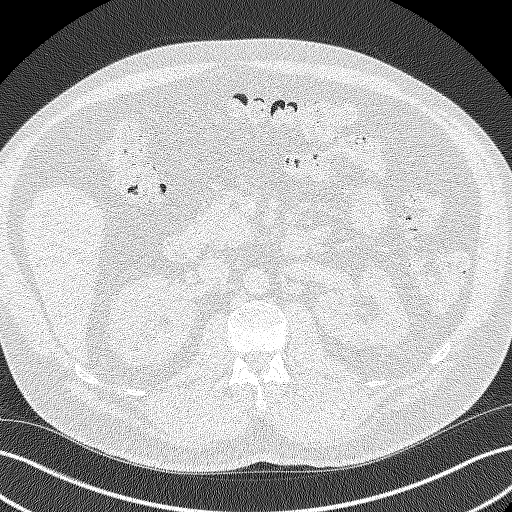
[im 49/357  lung]
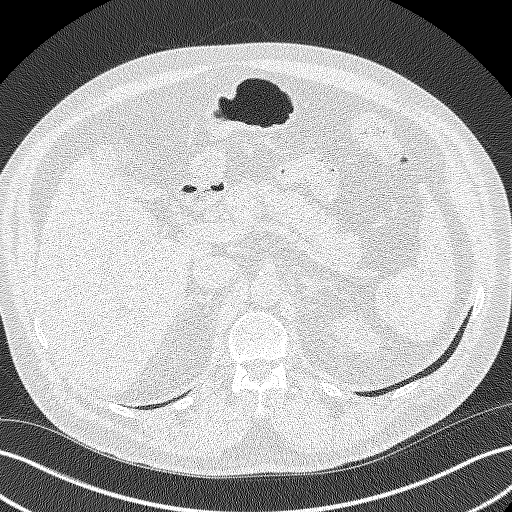
[im 81/357  lung]
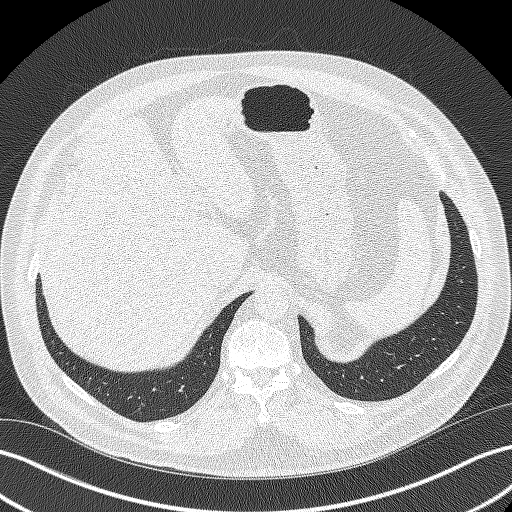
[im 114/357  lung]
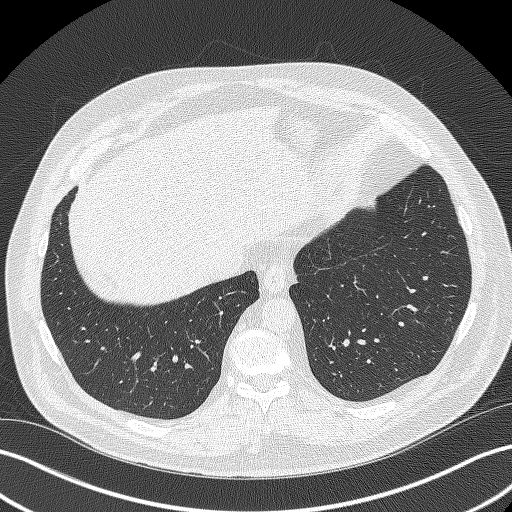
[im 130/357  mediastinal]
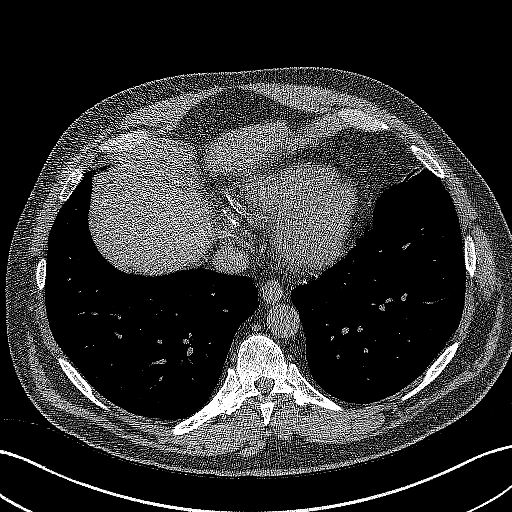
[im 130/357  lung]
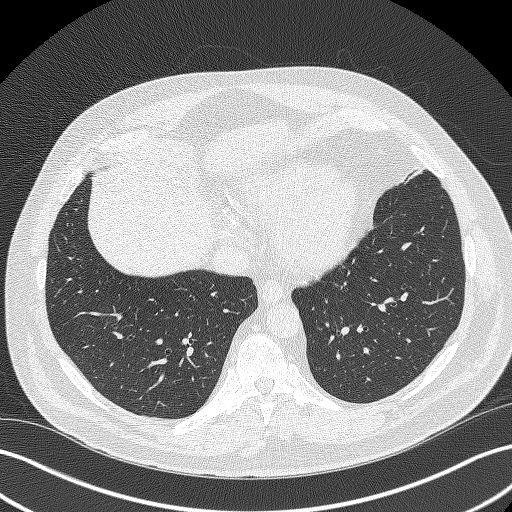
[im 162/357  lung]
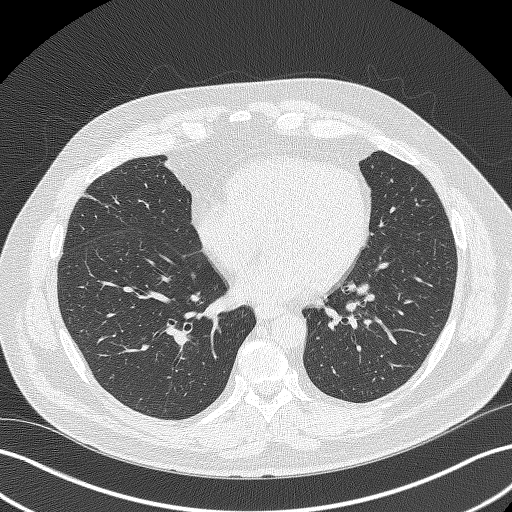
[im 195/357  lung]
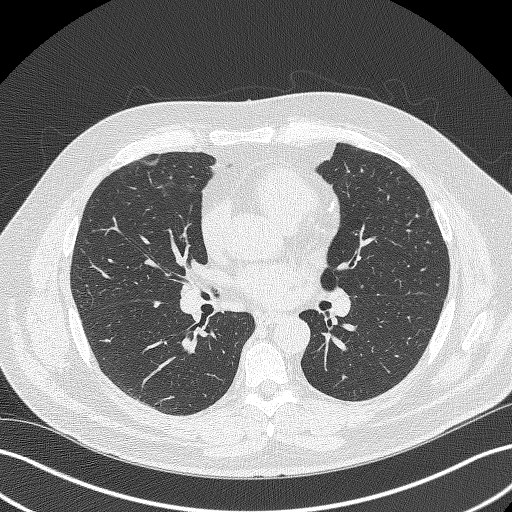
[im 227/357  lung]
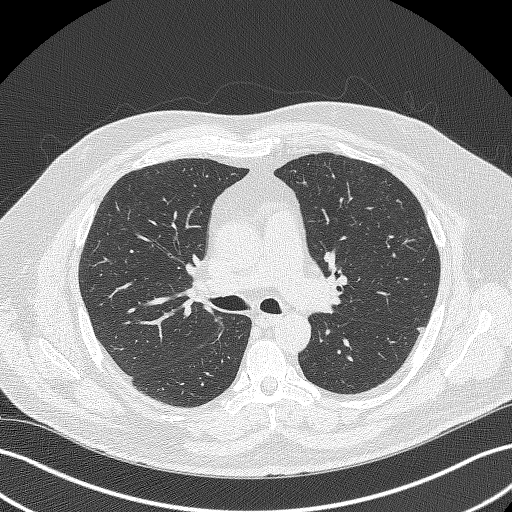
[im 243/357  mediastinal]
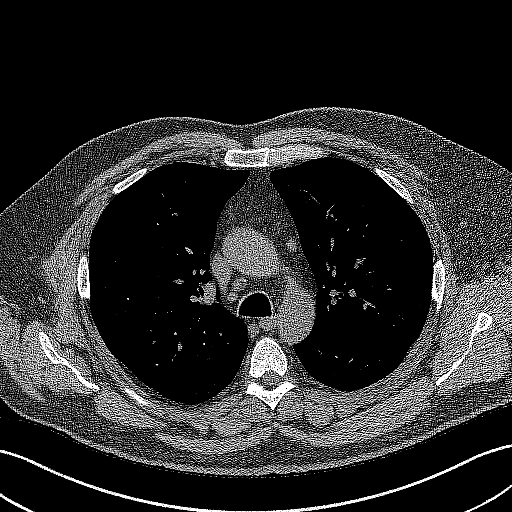
[im 243/357  lung]
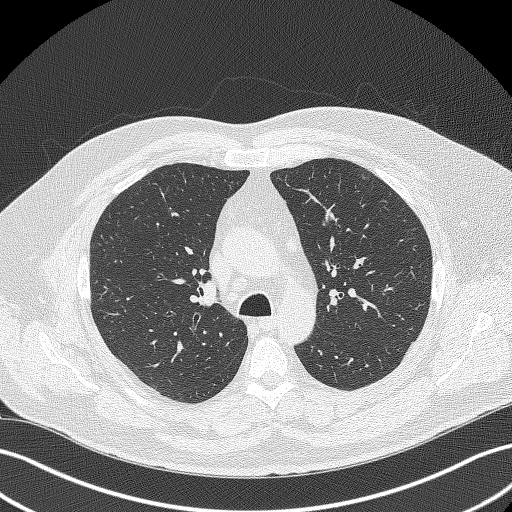
[im 276/357  lung]
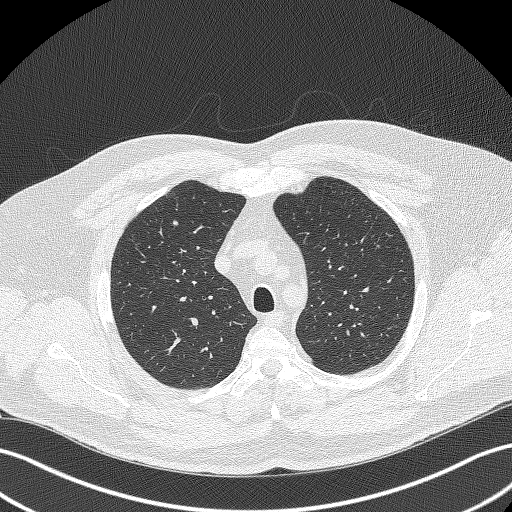
[im 308/357  lung]
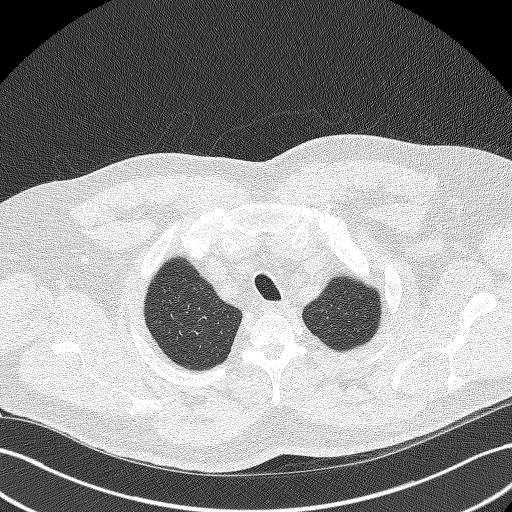
[im 340/357  lung]
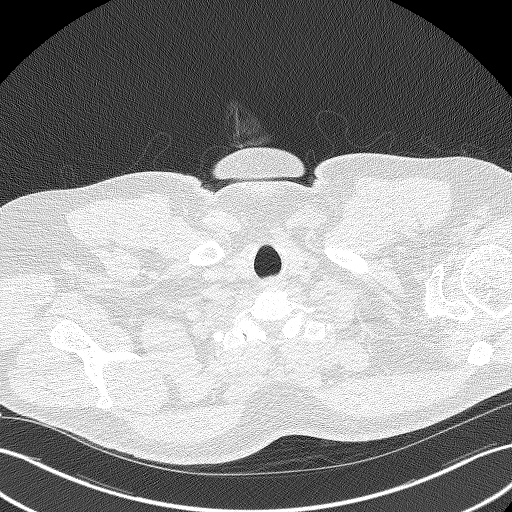

[Series 5: coronals lung 1.00 cor · coronal · 0.70mm/px · 3 of 334 slices shown]
[im 67/334  lung]
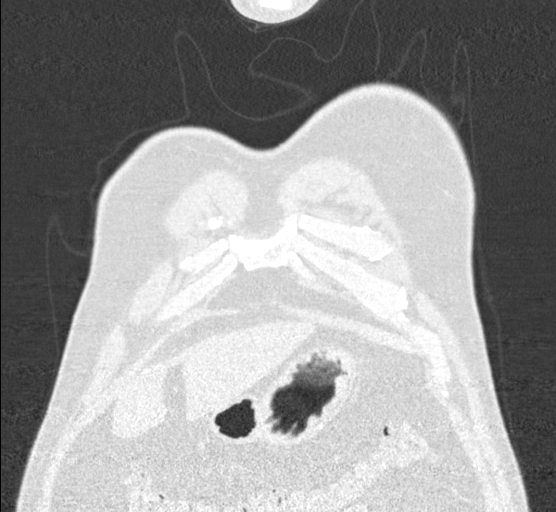
[im 134/334  lung]
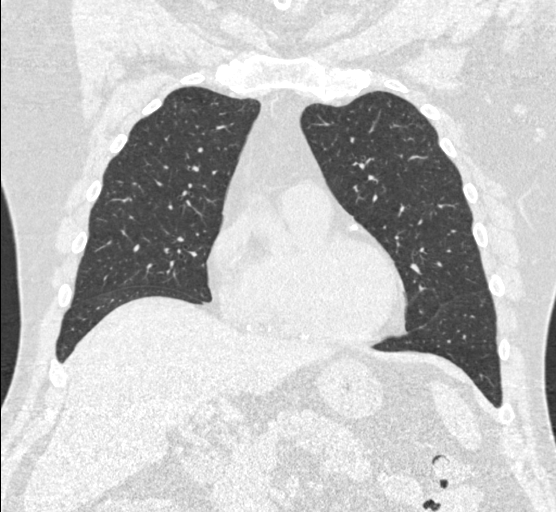
[im 200/334  lung]
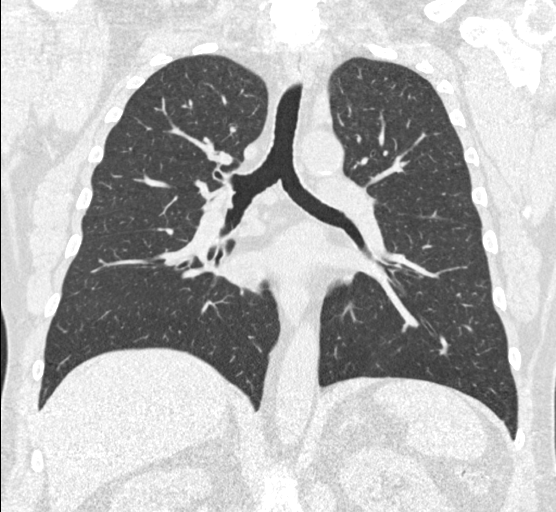

[15 of 40 positions shown; findings below may reference images not displayed]

FINDINGS: Cardiovascular: Aortic atherosclerosis. Tortuous thoracic aorta.
Normal heart size, without pericardial effusion. Three vessel
coronary artery calcification.

Mediastinum/Nodes: Prevascular nodes of up to 8 mm are upper normal
sized. No middle mediastinal adenopathy. Hilar regions poorly
evaluated without intravenous contrast.

Lungs/Pleura: No pleural fluid. Mild centrilobular emphysema.
Pulmonary nodules of maximally volume derived equivalent diameter
5.3 mm.

Upper Abdomen: Normal imaged portions of the liver, spleen stomach,
pancreas, gallbladder, adrenal glands, kidneys. Scattered colonic
diverticula.

Musculoskeletal: No acute osseous abnormality. Lower cervical
spondylosis. Mid to lower thoracic spondylosis.
IMPRESSION: 1. Lung-RADS 2, benign appearance or behavior. Continue annual
screening with low-dose chest CT without contrast in 12 months.
2. Aortic Atherosclerosis (TSF3F-XJW.W) and Emphysema (TSF3F-QDV.3).
Coronary artery atherosclerosis.
3. Borderline prevascular adenopathy, favored to be reactive.
Recommend attention on follow-up.

## 2022-02-07 ENCOUNTER — Other Ambulatory Visit: Payer: Self-pay | Admitting: Internal Medicine

## 2022-02-07 DIAGNOSIS — I10 Essential (primary) hypertension: Secondary | ICD-10-CM

## 2022-02-13 ENCOUNTER — Encounter: Payer: Medicaid Other | Admitting: Nurse Practitioner

## 2022-02-19 ENCOUNTER — Other Ambulatory Visit: Payer: Self-pay | Admitting: Nurse Practitioner

## 2022-02-19 DIAGNOSIS — F17210 Nicotine dependence, cigarettes, uncomplicated: Secondary | ICD-10-CM

## 2022-03-15 ENCOUNTER — Telehealth: Payer: Self-pay

## 2022-03-15 NOTE — Telephone Encounter (Signed)
Left vm to confirm 03/21/22 appointment-Toni ?

## 2022-03-21 ENCOUNTER — Encounter: Payer: Self-pay | Admitting: Nurse Practitioner

## 2022-03-21 ENCOUNTER — Other Ambulatory Visit: Payer: Self-pay

## 2022-03-21 ENCOUNTER — Ambulatory Visit: Payer: Medicaid Other | Admitting: Nurse Practitioner

## 2022-03-21 VITALS — BP 138/83 | HR 90 | Temp 98.8°F | Resp 16 | Ht 67.0 in | Wt 229.0 lb

## 2022-03-21 DIAGNOSIS — R918 Other nonspecific abnormal finding of lung field: Secondary | ICD-10-CM

## 2022-03-21 DIAGNOSIS — I10 Essential (primary) hypertension: Secondary | ICD-10-CM | POA: Diagnosis not present

## 2022-03-21 DIAGNOSIS — F331 Major depressive disorder, recurrent, moderate: Secondary | ICD-10-CM

## 2022-03-21 DIAGNOSIS — R7301 Impaired fasting glucose: Secondary | ICD-10-CM | POA: Diagnosis not present

## 2022-03-21 DIAGNOSIS — E782 Mixed hyperlipidemia: Secondary | ICD-10-CM

## 2022-03-21 DIAGNOSIS — F411 Generalized anxiety disorder: Secondary | ICD-10-CM

## 2022-03-21 DIAGNOSIS — E559 Vitamin D deficiency, unspecified: Secondary | ICD-10-CM | POA: Diagnosis not present

## 2022-03-21 DIAGNOSIS — Z125 Encounter for screening for malignant neoplasm of prostate: Secondary | ICD-10-CM

## 2022-03-21 DIAGNOSIS — Z0001 Encounter for general adult medical examination with abnormal findings: Secondary | ICD-10-CM

## 2022-03-21 DIAGNOSIS — Z79899 Other long term (current) drug therapy: Secondary | ICD-10-CM

## 2022-03-21 DIAGNOSIS — F5104 Psychophysiologic insomnia: Secondary | ICD-10-CM

## 2022-03-21 DIAGNOSIS — E538 Deficiency of other specified B group vitamins: Secondary | ICD-10-CM

## 2022-03-21 DIAGNOSIS — R300 Dysuria: Secondary | ICD-10-CM

## 2022-03-21 DIAGNOSIS — M545 Low back pain, unspecified: Secondary | ICD-10-CM

## 2022-03-21 LAB — POCT URINE DRUG SCREEN
Methylenedioxyamphetamine: NOT DETECTED
POC Amphetamine UR: NOT DETECTED
POC BENZODIAZEPINES UR: POSITIVE — AB
POC Barbiturate UR: NOT DETECTED
POC Cocaine UR: NOT DETECTED
POC Ecstasy UR: NOT DETECTED
POC Marijuana UR: POSITIVE — AB
POC Methadone UR: NOT DETECTED
POC Methamphetamine UR: NOT DETECTED
POC Opiate Ur: NOT DETECTED
POC Oxycodone UR: NOT DETECTED
POC PHENCYCLIDINE UR: NOT DETECTED
POC TRICYCLICS UR: NOT DETECTED

## 2022-03-21 MED ORDER — CITALOPRAM HYDROBROMIDE 40 MG PO TABS: ORAL_TABLET | ORAL | 1 refills | Status: DC

## 2022-03-21 MED ORDER — ZOLPIDEM TARTRATE 10 MG PO TABS: 10.0000 mg | ORAL_TABLET | Freq: Every day | ORAL | 2 refills | Status: DC

## 2022-03-21 MED ORDER — CYCLOBENZAPRINE HCL 5 MG PO TABS: 5.0000 mg | ORAL_TABLET | Freq: Every evening | ORAL | 1 refills | Status: DC | PRN

## 2022-03-21 MED ORDER — ALPRAZOLAM 0.5 MG PO TABS: 0.5000 mg | ORAL_TABLET | Freq: Every evening | ORAL | 2 refills | Status: DC | PRN

## 2022-03-21 MED ORDER — AMLODIPINE BESYLATE 10 MG PO TABS: 10.0000 mg | ORAL_TABLET | Freq: Every day | ORAL | 1 refills | Status: DC

## 2022-03-21 NOTE — Progress Notes (Signed)
[facility] ?[address] ?[place], [place] [address] ? ?Internal MEDICINE  ?Office Visit Note ? ?Patient Name: Dustin Heath ? [id number]  ?[id number] ? ?Date of Service: 03/21/2022 ? ?Chief Complaint  ?Patient presents with  ? Annual Exam  ?  Spots on face, possible referral to derm  ? Anxiety  ? Medication Refill  ? Depression  ? Hypertension  ? Hyperlipidemia  ? ? ?HPI ?Dustin Heath presents for an annual well visit and physical exam.  He is a well-appearing 62 year old male with anxiety, depression, hypertension, hyperlipidemia, and insomnia.  He is taking alprazolam for anxiety and is due for drug screen today.  Drug screen was also positive for marijuana, patient reports that he does take CBD Gummies to help him sleep at night. ?He is due for routine labs and prostate cancer screening.  He is not due for colonoscopy until March 2025. ?He continues to smoke cigarettes, despite the risk of lung cancer and the lung nodules that were found found on his CT chest last year.  Will need repeat CT chest later this year. ? ? ? ? ?Current Medication: ?Outpatient Encounter Medications as of 03/21/2022  ?Medication Sig  ? aspirin EC 81 MG tablet Take 81 mg by mouth daily.  ? atorvastatin (LIPITOR) 40 MG tablet Take 1 tablet (40 mg total) by mouth daily.  ? Cholecalciferol (VITAMIN D-3) 25 MCG (1000 UT) CAPS Take by mouth.  ? fluticasone (FLONASE) 50 MCG/ACT nasal spray Place into both nostrils daily.  ? Garlic 2549 MG CAPS Take by mouth.  ? lisinopril-hydrochlorothiazide (ZESTORETIC) 20-12.5 MG tablet TAKE 2 TABLETS BY MOUTH DAILY.  ? Magnesium 250 MG TABS Take by mouth.  ? Multiple Vitamins-Minerals (CENTRUM SILVER 50+MEN) TABS Take by mouth.  ? vitamin E 180 MG (400 UNITS) capsule Take 400 Units by mouth daily.  ? [DISCONTINUED] ALPRAZolam (XANAX) 0.5 MG tablet Take 1-1.5 tablets (0.5-0.75 mg total) by mouth at bedtime as needed for anxiety or sleep.  ? [DISCONTINUED] amLODipine (NORVASC) 10 MG tablet Take 1 tablet (10  mg total) by mouth daily.  ? [DISCONTINUED] citalopram (CELEXA) 40 MG tablet TAKE ONE TAB BY MOUTH DAILY FOR GENERALIZED ANXIETY DISORDER  ? [DISCONTINUED] cyclobenzaprine (FLEXERIL) 5 MG tablet Take 1 tablet (5 mg total) by mouth at bedtime as needed for muscle spasms.  ? [DISCONTINUED] nicotine polacrilex (NICORETTE) 4 MG gum TAKE 1 LOZENGE (4 MG TOTAL) BY MOUTH AS NEEDED FOR SMOKING CESSATION.  ? [DISCONTINUED] NICOTINE STEP 2 14 MG/24HR patch PLACE 1 PATCH ONTO SKIN DAILY  ? [DISCONTINUED] zolpidem (AMBIEN) 10 MG tablet Take 1 tablet (10 mg total) by mouth at bedtime.  ? ALPRAZolam (XANAX) 0.5 MG tablet Take 1-1.5 tablets (0.5-0.75 mg total) by mouth at bedtime as needed for anxiety or sleep.  ? amLODipine (NORVASC) 10 MG tablet Take 1 tablet (10 mg total) by mouth daily.  ? citalopram (CELEXA) 40 MG tablet TAKE ONE TAB BY MOUTH DAILY FOR GENERALIZED ANXIETY DISORDER  ? cyclobenzaprine (FLEXERIL) 5 MG tablet Take 1 tablet (5 mg total) by mouth at bedtime as needed for muscle spasms.  ? zolpidem (AMBIEN) 10 MG tablet Take 1 tablet (10 mg total) by mouth at bedtime.  ? ?No facility-administered encounter medications on file as of 03/21/2022.  ? ? ?Surgical History: ?Past Surgical History:  ?Procedure Laterality Date  ? APPENDECTOMY  62 yrs old  ? CATARACT EXTRACTION W/PHACO Right 05/11/2020  ? Procedure: CATARACT EXTRACTION PHACO AND INTRAOCULAR LENS PLACEMENT (IOC) RIGHT 4.59 00:40.1 11.4%;  Surgeon:  Leandrew Koyanagi, MD;  Location: [facility];  Service: Ophthalmology;  Laterality: Right;  ? CATARACT EXTRACTION W/PHACO Left 06/08/2020  ? Procedure: CATARACT EXTRACTION PHACO AND INTRAOCULAR LENS PLACEMENT (IOC) LEFT WITH ANTERIOR VITRECTOMY;  Surgeon: Leandrew Koyanagi, MD;  Location: [facility];  Service: Ophthalmology;  Laterality: Left;  4.47 ?0:35.8 ?12.4%  ? NOSE SURGERY  2000  ? Septorhinoplasty  ? ? ?Medical History: ?Past Medical History:  ?Diagnosis Date  ? Depression   ? Dyspnea   ?  occasional with exertion  ? Hyperlipidemia   ? Hypertension   ? controlled on meds  ? Insomnia   ? Nasal obstruction   ? left side nasal-severe restriction of air flow  ? Orthopnea   ? Panic disorder   ? Sleep apnea   ? moderate-no CPAP  ? ? ?Family History: ?Family History  ?Problem Relation Age of Onset  ? Hypertension Mother   ? Hypertension Father   ? ? ?Social History  ? ?Socioeconomic History  ? Marital status: Single  ?  Spouse name: Not on file  ? Number of children: Not on file  ? Years of education: Not on file  ? Highest education level: Not on file  ?Occupational History  ? Not on file  ?Tobacco Use  ? Smoking status: Every Day  ?  Packs/day: 1.00  ?  Years: 40.00  ?  Pack years: 40.00  ?  Types: Cigarettes  ?  Last attempt to quit: 06/20/2021  ?  Years since quitting: 0.7  ? Smokeless tobacco: Never  ?Vaping Use  ? Vaping Use: Never used  ?Substance and Sexual Activity  ? Alcohol use: Never  ? Drug use: Never  ? Sexual activity: Not on file  ?Other Topics Concern  ? Not on file  ?Social History Narrative  ? Not on file  ? ?Social Determinants of Health  ? ?Financial Resource Strain: Not on file  ?Food Insecurity: Not on file  ?Transportation Needs: Not on file  ?Physical Activity: Not on file  ?Stress: Not on file  ?Social Connections: Not on file  ?Intimate Partner Violence: Not on file  ? ? ? ? ?Review of Systems  ?Constitutional:  Negative for activity change, appetite change, chills, fatigue, fever and unexpected weight change.  ?HENT: Negative.  Negative for congestion, ear pain, rhinorrhea, sore throat and trouble swallowing.   ?Eyes: Negative.   ?Respiratory: Negative.  Negative for cough, chest tightness, shortness of breath and wheezing.   ?Cardiovascular:  Negative for chest pain and palpitations.  ?Gastrointestinal: Negative.  Negative for abdominal pain, blood in stool, constipation, diarrhea, nausea and vomiting.  ?Endocrine: Negative.   ?Genitourinary: Negative.  Negative for difficulty  urinating, dysuria, frequency, hematuria and urgency.  ?Musculoskeletal: Negative.  Negative for arthralgias, back pain, joint swelling, myalgias and neck pain.  ?Skin: Negative.  Negative for rash and wound.  ?Allergic/Immunologic: Negative.  Negative for immunocompromised state.  ?Neurological: Negative.  Negative for dizziness, seizures, numbness and headaches.  ?Hematological: Negative.   ?Psychiatric/Behavioral:  Negative for behavioral problems, self-injury and suicidal ideas. The patient is not nervous/anxious.   ? ?Vital Signs: ?BP 138/83   Pulse 90   Temp 98.8 ?F (37.1 ?C)   Resp 16   Ht '5\' 7"'$  (1.702 m)   Wt 229 lb (103.9 kg)   SpO2 98%   BMI 35.87 kg/m?  ? ? ?Physical Exam ?Vitals reviewed.  ?Constitutional:   ?   General: He is awake. He is not in acute distress. ?  Appearance: Normal appearance. He is well-developed and well-groomed. He is obese. He is not ill-appearing or diaphoretic.  ?HENT:  ?   Head: Normocephalic and atraumatic.  ?   Right Ear: Tympanic membrane, ear canal and external ear normal.  ?   Left Ear: Tympanic membrane, ear canal and external ear normal.  ?   Nose: Nose normal. No congestion or rhinorrhea.  ?   Mouth/Throat:  ?   Lips: Pink.  ?   Mouth: Mucous membranes are moist.  ?   Pharynx: Oropharynx is clear. Uvula midline. No oropharyngeal exudate or posterior oropharyngeal erythema.  ?Eyes:  ?   General: Lids are normal. Vision grossly intact. Gaze aligned appropriately. No scleral icterus.    ?   Right eye: No discharge.     ?   Left eye: No discharge.  ?   Extraocular Movements: Extraocular movements intact.  ?   Conjunctiva/sclera: Conjunctivae normal.  ?   Pupils: Pupils are equal, round, and reactive to light.  ?   Funduscopic exam: ?   Right eye: Red reflex present.     ?   Left eye: Red reflex present. ?Neck:  ?   Thyroid: No thyromegaly.  ?   Vascular: No JVD.  ?   Trachea: Trachea and phonation normal. No tracheal deviation.  ?Cardiovascular:  ?   Rate and Rhythm:  Normal rate and regular rhythm.  ?   Pulses: Normal pulses.  ?   Heart sounds: Normal heart sounds, S1 normal and S2 normal. No murmur heard. ?  No friction rub. No gallop.  ?Pulmonary:  ?   Effort: Pulmonary e

## 2022-03-22 LAB — UA/M W/RFLX CULTURE, ROUTINE
Bilirubin, UA: NEGATIVE
Glucose, UA: NEGATIVE
Ketones, UA: NEGATIVE
Leukocytes,UA: NEGATIVE
Nitrite, UA: NEGATIVE
Protein,UA: NEGATIVE
RBC, UA: NEGATIVE
Specific Gravity, UA: 1.006 (ref 1.005–1.030)
Urobilinogen, Ur: 0.2 mg/dL (ref 0.2–1.0)
pH, UA: 7 (ref 5.0–7.5)

## 2022-03-22 LAB — MICROSCOPIC EXAMINATION
Bacteria, UA: NONE SEEN
Casts: NONE SEEN /lpf
Epithelial Cells (non renal): NONE SEEN /hpf (ref 0–10)
RBC, Urine: NONE SEEN /hpf (ref 0–2)
WBC, UA: NONE SEEN /hpf (ref 0–5)

## 2022-04-01 ENCOUNTER — Encounter: Payer: Self-pay | Admitting: Nurse Practitioner

## 2022-05-01 ENCOUNTER — Other Ambulatory Visit: Payer: Self-pay | Admitting: Internal Medicine

## 2022-05-01 DIAGNOSIS — I10 Essential (primary) hypertension: Secondary | ICD-10-CM

## 2022-05-01 DIAGNOSIS — E782 Mixed hyperlipidemia: Secondary | ICD-10-CM

## 2022-06-02 LAB — CMP14+EGFR
ALT: 19 IU/L (ref 0–44)
AST: 16 IU/L (ref 0–40)
Albumin/Globulin Ratio: 1.7 (ref 1.2–2.2)
Albumin: 4.7 g/dL (ref 3.8–4.8)
Alkaline Phosphatase: 71 IU/L (ref 44–121)
BUN/Creatinine Ratio: 14 (ref 10–24)
BUN: 17 mg/dL (ref 8–27)
Bilirubin Total: 0.5 mg/dL (ref 0.0–1.2)
CO2: 21 mmol/L (ref 20–29)
Calcium: 10 mg/dL (ref 8.6–10.2)
Chloride: 97 mmol/L (ref 96–106)
Creatinine, Ser: 1.23 mg/dL (ref 0.76–1.27)
Globulin, Total: 2.8 g/dL (ref 1.5–4.5)
Glucose: 120 mg/dL — ABNORMAL HIGH (ref 70–99)
Potassium: 4.4 mmol/L (ref 3.5–5.2)
Sodium: 136 mmol/L (ref 134–144)
Total Protein: 7.5 g/dL (ref 6.0–8.5)
eGFR: 67 mL/min/{1.73_m2} (ref >59)

## 2022-06-02 LAB — CBC WITH DIFFERENTIAL/PLATELET
Basophils Absolute: 0.1 10*3/uL (ref 0.0–0.2)
Basos: 1 %
EOS (ABSOLUTE): 0.1 10*3/uL (ref 0.0–0.4)
Eos: 1 %
Hematocrit: 44.3 % (ref 37.5–51.0)
Hemoglobin: 15.6 g/dL (ref 13.0–17.7)
Immature Grans (Abs): 0 10*3/uL (ref 0.0–0.1)
Immature Granulocytes: 0 %
Lymphocytes Absolute: 2.2 10*3/uL (ref 0.7–3.1)
Lymphs: 24 %
MCH: 31.1 pg (ref 26.6–33.0)
MCHC: 35.2 g/dL (ref 31.5–35.7)
MCV: 88 fL (ref 79–97)
Monocytes Absolute: 0.8 10*3/uL (ref 0.1–0.9)
Monocytes: 9 %
Neutrophils Absolute: 6 10*3/uL (ref 1.4–7.0)
Neutrophils: 65 %
Platelets: 366 10*3/uL (ref 150–450)
RBC: 5.01 x10E6/uL (ref 4.14–5.80)
RDW: 12.5 % (ref 11.6–15.4)
WBC: 9.2 10*3/uL (ref 3.4–10.8)

## 2022-06-02 LAB — TSH+FREE T4
Free T4: 1.46 ng/dL (ref 0.82–1.77)
TSH: 1.68 u[IU]/mL (ref 0.450–4.500)

## 2022-06-02 LAB — B12 AND FOLATE PANEL
Folate: >20 ng/mL (ref >3.0)
Vitamin B-12: 754 pg/mL (ref 232–1245)

## 2022-06-02 LAB — PSA TOTAL (REFLEX TO FREE): Prostate Specific Ag, Serum: 0.7 ng/mL (ref 0.0–4.0)

## 2022-06-02 LAB — LIPID PANEL
Chol/HDL Ratio: 4.7 ratio (ref 0.0–5.0)
Cholesterol, Total: 149 mg/dL (ref 100–199)
HDL: 32 mg/dL — ABNORMAL LOW (ref >39)
LDL Chol Calc (NIH): 94 mg/dL (ref 0–99)
Triglycerides: 126 mg/dL (ref 0–149)
VLDL Cholesterol Cal: 23 mg/dL (ref 5–40)

## 2022-06-02 LAB — HGB A1C W/O EAG: Hgb A1c MFr Bld: 5.9 % — ABNORMAL HIGH (ref 4.8–5.6)

## 2022-06-02 LAB — VITAMIN D 25 HYDROXY (VIT D DEFICIENCY, FRACTURES): Vit D, 25-Hydroxy: 46.7 ng/mL (ref 30.0–100.0)

## 2022-06-07 NOTE — Progress Notes (Signed)
I have reviewed the lab results. There are no critically abnormal values requiring immediate intervention but there are some abnormals that will be discussed at the next office visit.  

## 2022-06-27 ENCOUNTER — Encounter: Payer: Self-pay | Admitting: Nurse Practitioner

## 2022-06-27 ENCOUNTER — Telehealth: Payer: Self-pay

## 2022-06-27 ENCOUNTER — Ambulatory Visit: Payer: Medicaid Other | Admitting: Nurse Practitioner

## 2022-06-27 VITALS — BP 138/78 | HR 90 | Temp 98.8°F | Resp 16 | Ht 67.0 in | Wt 228.4 lb

## 2022-06-27 DIAGNOSIS — Z760 Encounter for issue of repeat prescription: Secondary | ICD-10-CM

## 2022-06-27 DIAGNOSIS — F5104 Psychophysiologic insomnia: Secondary | ICD-10-CM

## 2022-06-27 DIAGNOSIS — F411 Generalized anxiety disorder: Secondary | ICD-10-CM

## 2022-06-27 DIAGNOSIS — D224 Melanocytic nevi of scalp and neck: Secondary | ICD-10-CM | POA: Diagnosis not present

## 2022-06-27 DIAGNOSIS — I10 Essential (primary) hypertension: Secondary | ICD-10-CM | POA: Diagnosis not present

## 2022-06-27 DIAGNOSIS — F331 Major depressive disorder, recurrent, moderate: Secondary | ICD-10-CM

## 2022-06-27 MED ORDER — ALPRAZOLAM 0.5 MG PO TABS: 0.5000 mg | ORAL_TABLET | Freq: Every evening | ORAL | 2 refills | Status: DC | PRN

## 2022-06-27 MED ORDER — CITALOPRAM HYDROBROMIDE 40 MG PO TABS: ORAL_TABLET | ORAL | 1 refills | Status: DC

## 2022-06-27 MED ORDER — TIZANIDINE HCL 4 MG PO TABS
4.0000 mg | ORAL_TABLET | Freq: Every day | ORAL | 2 refills | Status: DC
Start: 2022-06-27 — End: 2022-09-26

## 2022-06-27 MED ORDER — AMLODIPINE BESYLATE 10 MG PO TABS: 10.0000 mg | ORAL_TABLET | Freq: Every day | ORAL | 1 refills | Status: DC

## 2022-06-27 MED ORDER — ZOLPIDEM TARTRATE 10 MG PO TABS: 10.0000 mg | ORAL_TABLET | Freq: Every day | ORAL | 2 refills | Status: DC

## 2022-06-27 NOTE — Telephone Encounter (Signed)
Awaiting 06/27/22 office notes for dermatology referral-Toni

## 2022-06-27 NOTE — Progress Notes (Signed)
[facility] [place], [place] [address]  Internal MEDICINE  Office Visit Note  Patient Name: Dustin Heath  [id number]  [id number]  Date of Service: 06/27/2022  Chief Complaint  Patient presents with   Follow-up   Depression   Hyperlipidemia   Hypertension   Anxiety   Medication Refill    HPI  Dustin Heath presents for follow-up visit for hypertension, hyperlipidemia, depression, anxiety and medication refills.  The patient had his annual physical exam in March at his previous office visit.  He had his routine labs drawn a few weeks ago and those results were discussed with him today. Cholesterol levels are significantly improved since last year. The rest of his labs were grossly normal.  Still thinking about having corrective surgery on his nose due to having issues with sleeping.  Not sure if insurance will cover? PSA is normal.     Current Medication: Outpatient Encounter Medications as of 06/27/2022  Medication Sig   aspirin EC 81 MG tablet Take 81 mg by mouth daily.   atorvastatin (LIPITOR) 40 MG tablet TAKE 1 TABLET (40 MG TOTAL) BY MOUTH DAILY.   Cholecalciferol (VITAMIN D-3) 25 MCG (1000 UT) CAPS Take by mouth.   cyclobenzaprine (FLEXERIL) 5 MG tablet Take 1 tablet (5 mg total) by mouth at bedtime as needed for muscle spasms.   fluticasone (FLONASE) 50 MCG/ACT nasal spray Place into both nostrils daily.   Garlic 1751 MG CAPS Take by mouth.   lisinopril-hydrochlorothiazide (ZESTORETIC) 20-12.5 MG tablet TAKE 2 TABLETS BY MOUTH DAILY   Magnesium 250 MG TABS Take by mouth.   Multiple Vitamins-Minerals (CENTRUM SILVER 50+MEN) TABS Take by mouth.   tiZANidine (ZANAFLEX) 4 MG tablet Take 1 tablet (4 mg total) by mouth at bedtime.   vitamin E 180 MG (400 UNITS) capsule Take 400 Units by mouth daily.   [DISCONTINUED] ALPRAZolam (XANAX) 0.5 MG tablet Take 1-1.5 tablets (0.5-0.75 mg total) by mouth at bedtime as needed for anxiety or sleep.   [DISCONTINUED]  amLODipine (NORVASC) 10 MG tablet Take 1 tablet (10 mg total) by mouth daily.   [DISCONTINUED] citalopram (CELEXA) 40 MG tablet TAKE ONE TAB BY MOUTH DAILY FOR GENERALIZED ANXIETY DISORDER   [DISCONTINUED] zolpidem (AMBIEN) 10 MG tablet Take 1 tablet (10 mg total) by mouth at bedtime.   ALPRAZolam (XANAX) 0.5 MG tablet Take 1-1.5 tablets (0.5-0.75 mg total) by mouth at bedtime as needed for anxiety or sleep.   amLODipine (NORVASC) 10 MG tablet Take 1 tablet (10 mg total) by mouth daily.   citalopram (CELEXA) 40 MG tablet TAKE ONE TAB BY MOUTH DAILY FOR GENERALIZED ANXIETY DISORDER   zolpidem (AMBIEN) 10 MG tablet Take 1 tablet (10 mg total) by mouth at bedtime.   No facility-administered encounter medications on file as of 06/27/2022.    Surgical History: Past Surgical History:  Procedure Laterality Date   APPENDECTOMY  62 yrs old   CATARACT EXTRACTION W/PHACO Right 05/11/2020   Procedure: CATARACT EXTRACTION PHACO AND INTRAOCULAR LENS PLACEMENT (IOC) RIGHT 4.59 00:40.1 11.4%;  Surgeon: Leandrew Koyanagi, MD;  Location: [place];  Service: Ophthalmology;  Laterality: Right;   CATARACT EXTRACTION W/PHACO Left 06/08/2020   Procedure: CATARACT EXTRACTION PHACO AND INTRAOCULAR LENS PLACEMENT (IOC) LEFT WITH ANTERIOR VITRECTOMY;  Surgeon: Leandrew Koyanagi, MD;  Location: [place];  Service: Ophthalmology;  Laterality: Left;  4.47 0:35.8 12.4%   NOSE SURGERY  2000   Septorhinoplasty    Medical History: Past Medical History:  Diagnosis Date  Depression    Dyspnea    occasional with exertion   Hyperlipidemia    Hypertension    controlled on meds   Insomnia    Nasal obstruction    left side nasal-severe restriction of air flow   Orthopnea    Panic disorder    Sleep apnea    moderate-no CPAP    Family History: Family History  Problem Relation Age of Onset   Hypertension Mother    Hypertension Father     Social History   Socioeconomic History    Marital status: Single    Spouse name: Not on file   Number of children: Not on file   Years of education: Not on file   Highest education level: Not on file  Occupational History   Not on file  Tobacco Use   Smoking status: Every Day    Packs/day: 1.00    Years: 40.00    Total pack years: 40.00    Types: Cigarettes    Last attempt to quit: 06/20/2021    Years since quitting: 1.1   Smokeless tobacco: Never  Vaping Use   Vaping Use: Never used  Substance and Sexual Activity   Alcohol use: Never   Drug use: Never   Sexual activity: Not on file  Other Topics Concern   Not on file  Social History Narrative   Not on file   Social Determinants of Health   Financial Resource Strain: Not on file  Food Insecurity: Not on file  Transportation Needs: Not on file  Physical Activity: Not on file  Stress: Not on file  Social Connections: Not on file  Intimate Partner Violence: Not on file      Review of Systems  Constitutional:  Negative for chills, fatigue and unexpected weight change.  HENT:  Negative for congestion, rhinorrhea, sneezing and sore throat.   Eyes:  Negative for redness.  Respiratory:  Negative for cough, chest tightness and shortness of breath.   Cardiovascular:  Negative for chest pain and palpitations.  Gastrointestinal:  Negative for abdominal pain, constipation, diarrhea, nausea and vomiting.  Genitourinary:  Negative for dysuria and frequency.  Musculoskeletal:  Negative for arthralgias, back pain, joint swelling and neck pain.  Skin:  Negative for rash.  Neurological: Negative.  Negative for tremors and numbness.  Hematological:  Negative for adenopathy. Does not bruise/bleed easily.  Psychiatric/Behavioral:  Negative for behavioral problems (Depression), sleep disturbance and suicidal ideas. The patient is not nervous/anxious.     Vital Signs: BP 138/78   Pulse 90   Temp 98.8 F (37.1 C)   Resp 16   Ht '5\' 7"'$  (1.702 m)   Wt 228 lb 6.4 oz (103.6  kg)   SpO2 97%   BMI 35.77 kg/m    Physical Exam Vitals reviewed.  Constitutional:      General: He is not in acute distress.    Appearance: Normal appearance. He is obese. He is not ill-appearing.  HENT:     Head: Normocephalic and atraumatic.  Eyes:     Pupils: Pupils are equal, round, and reactive to light.  Cardiovascular:     Rate and Rhythm: Normal rate and regular rhythm.  Pulmonary:     Effort: Pulmonary effort is normal. No respiratory distress.  Neurological:     Mental Status: He is alert and oriented to person, place, and time.     Cranial Nerves: No cranial nerve deficit.     Coordination: Coordination normal.     Gait: Gait  normal.  Psychiatric:        Mood and Affect: Mood normal.        Behavior: Behavior normal.        Assessment/Plan: 1. Atypical mole of neck Refer to derm for evaluation and removal - Ambulatory referral to Dermatology  2. Essential (primary) hypertension Stable, no changes - amLODipine (NORVASC) 10 MG tablet; Take 1 tablet (10 mg total) by mouth daily.  Dispense: 90 tablet; Refill: 1  3. Medication refill - tiZANidine (ZANAFLEX) 4 MG tablet; Take 1 tablet (4 mg total) by mouth at bedtime.  Dispense: 30 tablet; Refill: 2 - citalopram (CELEXA) 40 MG tablet; TAKE ONE TAB BY MOUTH DAILY FOR GENERALIZED ANXIETY DISORDER  Dispense: 90 tablet; Refill: 1 - amLODipine (NORVASC) 10 MG tablet; Take 1 tablet (10 mg total) by mouth daily.  Dispense: 90 tablet; Refill: 1 - zolpidem (AMBIEN) 10 MG tablet; Take 1 tablet (10 mg total) by mouth at bedtime.  Dispense: 30 tablet; Refill: 2 - ALPRAZolam (XANAX) 0.5 MG tablet; Take 1-1.5 tablets (0.5-0.75 mg total) by mouth at bedtime as needed for anxiety or sleep.  Dispense: 45 tablet; Refill: 2   General Counseling: Burnham verbalizes understanding of the findings of todays visit and agrees with plan of treatment. I have discussed any further diagnostic evaluation that may be needed or ordered today.  We also reviewed his medications today. he has been encouraged to call the office with any questions or concerns that should arise related to todays visit.    Orders Placed This Encounter  Procedures   Ambulatory referral to Dermatology    Meds ordered this encounter  Medications   tiZANidine (ZANAFLEX) 4 MG tablet    Sig: Take 1 tablet (4 mg total) by mouth at bedtime.    Dispense:  30 tablet    Refill:  2   citalopram (CELEXA) 40 MG tablet    Sig: TAKE ONE TAB BY MOUTH DAILY FOR GENERALIZED ANXIETY DISORDER    Dispense:  90 tablet    Refill:  1    Maximum Refills Reached   amLODipine (NORVASC) 10 MG tablet    Sig: Take 1 tablet (10 mg total) by mouth daily.    Dispense:  90 tablet    Refill:  1   zolpidem (AMBIEN) 10 MG tablet    Sig: Take 1 tablet (10 mg total) by mouth at bedtime.    Dispense:  30 tablet    Refill:  2   ALPRAZolam (XANAX) 0.5 MG tablet    Sig: Take 1-1.5 tablets (0.5-0.75 mg total) by mouth at bedtime as needed for anxiety or sleep.    Dispense:  45 tablet    Refill:  2    Maximum Refills Reached    Return in about 3 months (around 09/27/2022) for F/U, med refill, Dustin Heath PCP.   Total time spent:30 Minutes Time spent includes review of chart, medications, test results, and follow up plan with the patient.   [place] Controlled Substance Database was reviewed by me.  This patient was seen by Jonetta Osgood, FNP-C in collaboration with Dr. Clayborn Bigness as a part of collaborative care agreement.   Asencion Loveday R. Valetta Fuller, MSN, FNP-C Internal medicine

## 2022-07-10 ENCOUNTER — Telehealth: Payer: Self-pay | Admitting: Acute Care

## 2022-07-10 NOTE — Telephone Encounter (Signed)
Spoke with pt who wanted to wait to talk to PCP in about 2 months.  Is requesting a call back.

## 2022-07-13 ENCOUNTER — Telehealth: Payer: Self-pay

## 2022-07-13 NOTE — Telephone Encounter (Signed)
Dermatology referral sent via [facility] to [facility] 

## 2022-07-17 ENCOUNTER — Telehealth: Payer: Self-pay

## 2022-07-17 NOTE — Telephone Encounter (Signed)
01/24/23 Dermatology appointment @ [facility]

## 2022-08-14 ENCOUNTER — Encounter: Payer: Self-pay | Admitting: Nurse Practitioner

## 2022-08-23 ENCOUNTER — Telehealth: Payer: Self-pay

## 2022-08-27 ENCOUNTER — Telehealth: Payer: Medicaid Other | Admitting: Nurse Practitioner

## 2022-08-28 ENCOUNTER — Telehealth: Payer: Medicaid Other | Admitting: Nurse Practitioner

## 2022-08-29 ENCOUNTER — Encounter: Payer: Self-pay | Admitting: Nurse Practitioner

## 2022-08-29 ENCOUNTER — Telehealth (INDEPENDENT_AMBULATORY_CARE_PROVIDER_SITE_OTHER): Payer: Medicaid Other | Admitting: Nurse Practitioner

## 2022-08-29 VITALS — Ht 67.0 in | Wt 228.0 lb

## 2022-08-29 DIAGNOSIS — H539 Unspecified visual disturbance: Secondary | ICD-10-CM

## 2022-08-29 NOTE — Progress Notes (Signed)
[facility] [place], [place] [address]  Internal MEDICINE  Telephone Visit  Patient Name: Dustin Heath  [id number]  [id number]  Date of Service: 08/29/2022  I connected with the patient at 1150 by telephone and verified the patients identity using two identifiers.   I discussed the limitations, risks, security and privacy concerns of performing an evaluation and management service by telephone and the availability of in person appointments. I also discussed with the patient that there may be a patient responsible charge related to the service.  The patient expressed understanding and agrees to proceed.    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 He has been taking extra of his medication and is running out.  Cataract surgery from June 2021,  Vision in left eye significantly worsened in the past 2 months, floaters and blurry shapes, was not seen by [facility] for a month when he called and told them what was happening.  Nerves tore, been taking more of his medications lately and may run out too soon.    Current Medication: Outpatient Encounter Medications as of 08/29/2022  Medication Sig   aspirin EC 81 MG tablet Take 81 mg by mouth daily.   atorvastatin (LIPITOR) 40 MG tablet TAKE 1 TABLET (40 MG TOTAL) BY MOUTH DAILY.   Cholecalciferol (VITAMIN D-3) 25 MCG (1000 UT) CAPS Take by mouth.   cyclobenzaprine (FLEXERIL) 5 MG tablet Take 1 tablet (5 mg total) by mouth at bedtime as needed for muscle spasms.   fluticasone (FLONASE) 50 MCG/ACT nasal spray Place into both nostrils daily.   Garlic 4742 MG CAPS Take by mouth.   Magnesium 250 MG TABS Take by mouth.   Multiple Vitamins-Minerals (CENTRUM SILVER 50+MEN) TABS Take by mouth.    vitamin E 180 MG (400 UNITS) capsule Take 400 Units by mouth daily.   [DISCONTINUED] ALPRAZolam (XANAX) 0.5 MG tablet Take 1-1.5 tablets (0.5-0.75 mg total) by mouth at bedtime as needed for anxiety or sleep.   [DISCONTINUED] amLODipine (NORVASC) 10 MG tablet Take 1 tablet (10 mg total) by mouth daily.   [DISCONTINUED] citalopram (CELEXA) 40 MG tablet TAKE ONE TAB BY MOUTH DAILY FOR GENERALIZED ANXIETY DISORDER   [DISCONTINUED] lisinopril-hydrochlorothiazide (ZESTORETIC) 20-12.5 MG tablet TAKE 2 TABLETS BY MOUTH DAILY   [DISCONTINUED] tiZANidine (ZANAFLEX) 4 MG tablet Take 1 tablet (4 mg total) by mouth at bedtime.   [DISCONTINUED] zolpidem (AMBIEN) 10 MG tablet Take 1 tablet (10 mg total) by mouth at bedtime.   No facility-administered encounter medications on file as of 08/29/2022.    Surgical History: Past Surgical History:  Procedure Laterality Date   APPENDECTOMY  63 yrs old   CATARACT EXTRACTION W/PHACO Right 05/11/2020   Procedure: CATARACT EXTRACTION PHACO AND INTRAOCULAR LENS PLACEMENT (IOC) RIGHT 4.59 00:40.1 11.4%;  Surgeon: Leandrew Koyanagi, MD;  Location: [place];  Service: Ophthalmology;  Laterality: Right;   CATARACT EXTRACTION W/PHACO Left 06/08/2020   Procedure: CATARACT EXTRACTION PHACO AND INTRAOCULAR LENS PLACEMENT (IOC) LEFT WITH ANTERIOR VITRECTOMY;  Surgeon: Leandrew Koyanagi, MD;  Location: [place];  Service: Ophthalmology;  Laterality: Left;  4.47 0:35.8 12.4%   NOSE SURGERY  2000   Septorhinoplasty    Medical History: Past Medical History:  Diagnosis Date   Depression    Dyspnea    occasional with exertion   Hyperlipidemia    Hypertension    controlled on meds   Insomnia    Nasal obstruction    left side nasal-severe restriction of air flow   Orthopnea    Panic disorder    Sleep apnea    moderate-no CPAP    Family History: Family History  Problem Relation Age of Onset   Hypertension Mother    Hypertension Father      Social History   Socioeconomic History   Marital status: Single    Spouse name: Not on file   Number of children: Not on file   Years of education: Not on file   Highest education level: Not on file  Occupational History   Not on file  Tobacco Use   Smoking status: Every Day    Packs/day: 1.00    Years: 40.00    Total pack years: 40.00    Types: Cigarettes    Last attempt to quit: 06/20/2021    Years since quitting: 1.3   Smokeless tobacco: Never  Vaping Use   Vaping Use: Never used  Substance and Sexual Activity   Alcohol use: Never   Drug use: Never   Sexual activity: Not on file  Other Topics Concern   Not on file  Social History Narrative   Not on file   Social Determinants of Health   Financial Resource Strain: Not on file  Food Insecurity: Not on file  Transportation Needs: Not on file  Physical Activity: Not on file  Stress: Not on file  Social Connections: Not on file  Intimate Partner Violence: Not on file      Review of Systems  Constitutional:  Negative for chills, fatigue and unexpected weight change.  HENT:  Negative for congestion, rhinorrhea, sneezing and sore throat.   Eyes:  Positive for visual disturbance. Negative for redness.  Respiratory:  Negative for cough, chest tightness and shortness of breath.   Cardiovascular:  Negative for chest pain and palpitations.  Gastrointestinal:  Negative for abdominal pain, constipation, diarrhea, nausea and vomiting.  Genitourinary:  Negative for dysuria and frequency.  Musculoskeletal:  Negative for arthralgias, back pain, joint swelling and neck pain.  Skin:  Negative for rash.  Neurological: Negative.  Negative for tremors and numbness.  Hematological:  Negative for adenopathy. Does not bruise/bleed easily.  Psychiatric/Behavioral:  Negative for behavioral problems (Depression), sleep disturbance and suicidal ideas. The patient is not nervous/anxious.     Vital Signs: Ht '5\' 7"'$  (1.702 m)   Wt  228 lb (103.4 kg)   BMI 35.71 kg/m    Observation/Objective: He is alert and oriented and engages in conversation appropriately. He does not sound as though he is in any acute distress.    Assessment/Plan: 1. Visual disturbance Already sees ophthalmology. Vision has become worse since his last eye surgery. Cannot drive anymore. Needs to find transportation that can pick him up. Discussed options with patient.    General Counseling: Dustin Heath verbalizes understanding of the findings of today's phone visit and agrees with plan of treatment. I have discussed any further diagnostic evaluation that may be needed or ordered today. We also reviewed his medications today. he has been encouraged to call the office with any questions or concerns that should arise related to todays visit.  Return in about 1 month (around 09/29/2022) for F/U, Dustin Heath PCP.   No orders of the defined types were placed in  this encounter.   No orders of the defined types were placed in this encounter.   Time spent:20 Minutes Time spent with patient included reviewing progress notes, labs, imaging studies, and discussing plan for follow up.  [place] Controlled Substance Database was reviewed by me for overdose risk score (ORS) if appropriate.  This patient was seen by Jonetta Osgood, FNP-C in collaboration with Dr. Clayborn Bigness as a part of collaborative care agreement.  Jonpaul Lumm R. Valetta Fuller, MSN, FNP-C Internal medicine

## 2022-08-31 NOTE — Telephone Encounter (Signed)
Error

## 2022-09-26 ENCOUNTER — Encounter: Payer: Self-pay | Admitting: Nurse Practitioner

## 2022-09-26 ENCOUNTER — Ambulatory Visit: Payer: Medicaid Other | Admitting: Nurse Practitioner

## 2022-09-26 VITALS — BP 114/65 | HR 95 | Temp 97.3°F | Resp 16 | Ht 67.0 in | Wt 223.0 lb

## 2022-09-26 DIAGNOSIS — F331 Major depressive disorder, recurrent, moderate: Secondary | ICD-10-CM | POA: Diagnosis not present

## 2022-09-26 DIAGNOSIS — Z658 Other specified problems related to psychosocial circumstances: Secondary | ICD-10-CM

## 2022-09-26 DIAGNOSIS — I10 Essential (primary) hypertension: Secondary | ICD-10-CM | POA: Diagnosis not present

## 2022-09-26 DIAGNOSIS — Z760 Encounter for issue of repeat prescription: Secondary | ICD-10-CM | POA: Diagnosis not present

## 2022-09-26 MED ORDER — AMLODIPINE BESYLATE 10 MG PO TABS: 10.0000 mg | ORAL_TABLET | Freq: Every day | ORAL | 1 refills | Status: DC

## 2022-09-26 MED ORDER — ALPRAZOLAM 0.5 MG PO TABS: 0.5000 mg | ORAL_TABLET | Freq: Every evening | ORAL | 2 refills | Status: DC | PRN

## 2022-09-26 MED ORDER — ZOLPIDEM TARTRATE 10 MG PO TABS: 10.0000 mg | ORAL_TABLET | Freq: Every day | ORAL | 2 refills | Status: DC

## 2022-09-26 MED ORDER — LISINOPRIL-HYDROCHLOROTHIAZIDE 20-12.5 MG PO TABS: 2.0000 | ORAL_TABLET | Freq: Every day | ORAL | 2 refills | Status: DC

## 2022-09-26 MED ORDER — CITALOPRAM HYDROBROMIDE 40 MG PO TABS: ORAL_TABLET | ORAL | 1 refills | Status: DC

## 2022-09-26 MED ORDER — TIZANIDINE HCL 4 MG PO TABS: 4.0000 mg | ORAL_TABLET | Freq: Every day | ORAL | 2 refills | Status: DC

## 2022-09-26 NOTE — Progress Notes (Signed)
[facility], [place] [address]  Internal MEDICINE  Office Visit Note  Patient Name: Dustin Heath  [id number]  [id number]  Date of Service: 09/26/2022  Chief Complaint  Patient presents with   Follow-up    Follow up med refill    Depression   Hypertension   Hyperlipidemia    HPI Dustin Heath presents for a follow up visit for hypertension, depression and refill.  Hypertension -- well controlled with current medications Depression -- having a rough time with his back pain and his vision is significantly worse in his left eye since he stopped getting treatments from the ophthalmologists. He does not have any close family and transportation is an issue. Decreased appetite, lost 5 lbs.  Psychosocial stressors -- does not have a vehicle, cannot drive and has trouble finding anyone to take him anywhere. 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 Recently had the rest of his teeth pulled and was fitted for full set of dentures which he is still getting used to.  Refills due     Current Medication: Outpatient Encounter Medications as of 09/26/2022  Medication Sig   aspirin EC 81 MG tablet Take 81 mg by mouth daily.   atorvastatin (LIPITOR) 40 MG tablet TAKE 1 TABLET (40 MG TOTAL) BY MOUTH DAILY.   Cholecalciferol (VITAMIN D-3) 25 MCG (1000 UT) CAPS Take by mouth.   cyclobenzaprine (FLEXERIL) 5 MG tablet Take 1 tablet (5 mg total) by mouth at bedtime as needed for muscle spasms.   fluticasone (FLONASE) 50 MCG/ACT nasal spray Place into both nostrils daily.   Garlic 1751 MG CAPS Take by mouth.   Magnesium 250 MG TABS Take by mouth.   Multiple Vitamins-Minerals (CENTRUM SILVER 50+MEN) TABS Take by mouth.   vitamin E 180 MG (400 UNITS) capsule Take 400 Units by mouth daily.   [DISCONTINUED] ALPRAZolam (XANAX) 0.5 MG tablet Take 1-1.5 tablets (0.5-0.75 mg total) by mouth at bedtime as needed for anxiety or sleep.    [DISCONTINUED] amLODipine (NORVASC) 10 MG tablet Take 1 tablet (10 mg total) by mouth daily.   [DISCONTINUED] citalopram (CELEXA) 40 MG tablet TAKE ONE TAB BY MOUTH DAILY FOR GENERALIZED ANXIETY DISORDER   [DISCONTINUED] lisinopril-hydrochlorothiazide (ZESTORETIC) 20-12.5 MG tablet TAKE 2 TABLETS BY MOUTH DAILY   [DISCONTINUED] tiZANidine (ZANAFLEX) 4 MG tablet Take 1 tablet (4 mg total) by mouth at bedtime.   [DISCONTINUED] zolpidem (AMBIEN) 10 MG tablet Take 1 tablet (10 mg total) by mouth at bedtime.   ALPRAZolam (XANAX) 0.5 MG tablet Take 1-1.5 tablets (0.5-0.75 mg total) by mouth at bedtime as needed for anxiety or sleep.   amLODipine (NORVASC) 10 MG tablet Take 1 tablet (10 mg total) by mouth daily.   citalopram (CELEXA) 40 MG tablet TAKE ONE TAB BY MOUTH DAILY FOR GENERALIZED ANXIETY DISORDER   lisinopril-hydrochlorothiazide (ZESTORETIC) 20-12.5 MG tablet Take 2 tablets by mouth daily.   tiZANidine (ZANAFLEX) 4 MG tablet Take 1 tablet (4 mg total) by mouth at bedtime.   zolpidem (AMBIEN) 10 MG tablet Take 1 tablet (10 mg total) by mouth at bedtime.   No facility-administered encounter medications on file as of 09/26/2022.    Surgical History: Past Surgical History:  Procedure Laterality Date   APPENDECTOMY  62 yrs old   CATARACT EXTRACTION W/PHACO Right 05/11/2020   Procedure: CATARACT EXTRACTION PHACO AND INTRAOCULAR LENS PLACEMENT (IOC) RIGHT 4.59 00:40.1 11.4%;  Surgeon: Leandrew Koyanagi, MD;  Location: [facility];  Service:  Ophthalmology;  Laterality: Right;   CATARACT EXTRACTION W/PHACO Left 06/08/2020   Procedure: CATARACT EXTRACTION PHACO AND INTRAOCULAR LENS PLACEMENT (IOC) LEFT WITH ANTERIOR VITRECTOMY;  Surgeon: Leandrew Koyanagi, MD;  Location: [facility];  Service: Ophthalmology;  Laterality: Left;  4.47 0:35.8 12.4%   NOSE SURGERY  2000   Septorhinoplasty    Medical History: Past Medical History:  Diagnosis Date   Depression    Dyspnea     occasional with exertion   Hyperlipidemia    Hypertension    controlled on meds   Insomnia    Nasal obstruction    left side nasal-severe restriction of air flow   Orthopnea    Panic disorder    Sleep apnea    moderate-no CPAP    Family History: Family History  Problem Relation Age of Onset   Hypertension Mother    Hypertension Father     Social History   Socioeconomic History   Marital status: Single    Spouse name: Not on file   Number of children: Not on file   Years of education: Not on file   Highest education level: Not on file  Occupational History   Not on file  Tobacco Use   Smoking status: Every Day    Packs/day: 1.00    Years: 40.00    Total pack years: 40.00    Types: Cigarettes    Last attempt to quit: 06/20/2021    Years since quitting: 1.2   Smokeless tobacco: Never  Vaping Use   Vaping Use: Never used  Substance and Sexual Activity   Alcohol use: Never   Drug use: Never   Sexual activity: Not on file  Other Topics Concern   Not on file  Social History Narrative   Not on file   Social Determinants of Health   Financial Resource Strain: Not on file  Food Insecurity: Not on file  Transportation Needs: Not on file  Physical Activity: Not on file  Stress: Not on file  Social Connections: Not on file  Intimate Partner Violence: Not on file      Review of Systems  Constitutional:  Negative for chills, fatigue and unexpected weight change.  HENT:  Negative for congestion, rhinorrhea, sneezing and sore throat.   Eyes:  Negative for redness.  Respiratory:  Negative for cough, chest tightness and shortness of breath.   Cardiovascular:  Negative for chest pain and palpitations.  Gastrointestinal:  Negative for abdominal pain, constipation, diarrhea, nausea and vomiting.  Genitourinary:  Negative for dysuria and frequency.  Musculoskeletal:  Negative for arthralgias, back pain, joint swelling and neck pain.  Skin:  Negative for rash.   Neurological: Negative.  Negative for tremors and numbness.  Hematological:  Negative for adenopathy. Does not bruise/bleed easily.  Psychiatric/Behavioral:  Negative for behavioral problems (Depression), sleep disturbance and suicidal ideas. The patient is not nervous/anxious.     Vital Signs: BP 114/65   Pulse 95   Temp (!) 97.3 F (36.3 C)   Resp 16   Ht '5\' 7"'$  (1.702 m)   Wt 223 lb (101.2 kg)   SpO2 96%   BMI 34.93 kg/m    Physical Exam Vitals reviewed.  Constitutional:      General: He is not in acute distress.    Appearance: Normal appearance. He is obese. He is not ill-appearing.  HENT:     Head: Normocephalic and atraumatic.  Eyes:     Pupils: Pupils are equal, round, and reactive to light.  Cardiovascular:  Rate and Rhythm: Normal rate and regular rhythm.  Pulmonary:     Effort: Pulmonary effort is normal. No respiratory distress.  Neurological:     Mental Status: He is alert and oriented to person, place, and time.  Psychiatric:        Mood and Affect: Mood normal.        Behavior: Behavior normal.        Assessment/Plan: 1. Essential (primary) hypertension Stable, continue medications as prescribed.  - amLODipine (NORVASC) 10 MG tablet; Take 1 tablet (10 mg total) by mouth daily.  Dispense: 90 tablet; Refill: 1 - lisinopril-hydrochlorothiazide (ZESTORETIC) 20-12.5 MG tablet; Take 2 tablets by mouth daily.  Dispense: 180 tablet; Refill: 2  2. Psychosocial stressors Patient is coping and working on finding some solutions for his issues. At home he likes to read which is a good activity to pass the time with  3. Moderate episode of recurrent major depressive disorder ([place]) Taking celexa which is helping to decrease his depressive symptoms some and he has some healthy coping skills such as reading books and going for walks.   4. Medication refill - ALPRAZolam (XANAX) 0.5 MG tablet; Take 1-1.5 tablets (0.5-0.75 mg total) by mouth at bedtime as needed  for anxiety or sleep.  Dispense: 45 tablet; Refill: 2 - amLODipine (NORVASC) 10 MG tablet; Take 1 tablet (10 mg total) by mouth daily.  Dispense: 90 tablet; Refill: 1 - citalopram (CELEXA) 40 MG tablet; TAKE ONE TAB BY MOUTH DAILY FOR GENERALIZED ANXIETY DISORDER  Dispense: 90 tablet; Refill: 1 - tiZANidine (ZANAFLEX) 4 MG tablet; Take 1 tablet (4 mg total) by mouth at bedtime.  Dispense: 30 tablet; Refill: 2 - zolpidem (AMBIEN) 10 MG tablet; Take 1 tablet (10 mg total) by mouth at bedtime.  Dispense: 30 tablet; Refill: 2   General Counseling: Keron verbalizes understanding of the findings of todays visit and agrees with plan of treatment. I have discussed any further diagnostic evaluation that may be needed or ordered today. We also reviewed his medications today. he has been encouraged to call the office with any questions or concerns that should arise related to todays visit.    No orders of the defined types were placed in this encounter.   Meds ordered this encounter  Medications   ALPRAZolam (XANAX) 0.5 MG tablet    Sig: Take 1-1.5 tablets (0.5-0.75 mg total) by mouth at bedtime as needed for anxiety or sleep.    Dispense:  45 tablet    Refill:  2    Maximum Refills Reached   amLODipine (NORVASC) 10 MG tablet    Sig: Take 1 tablet (10 mg total) by mouth daily.    Dispense:  90 tablet    Refill:  1   citalopram (CELEXA) 40 MG tablet    Sig: TAKE ONE TAB BY MOUTH DAILY FOR GENERALIZED ANXIETY DISORDER    Dispense:  90 tablet    Refill:  1    Maximum Refills Reached   tiZANidine (ZANAFLEX) 4 MG tablet    Sig: Take 1 tablet (4 mg total) by mouth at bedtime.    Dispense:  30 tablet    Refill:  2   zolpidem (AMBIEN) 10 MG tablet    Sig: Take 1 tablet (10 mg total) by mouth at bedtime.    Dispense:  30 tablet    Refill:  2   lisinopril-hydrochlorothiazide (ZESTORETIC) 20-12.5 MG tablet    Sig: Take 2 tablets by mouth daily.    Dispense:  180 tablet    Refill:  2    Maximum  Refills Reached    Return in about 3 months (around 12/26/2022) for F/U, Anxiety/depression, anxiety med refill, Lidia Clavijo PCP.   Total time spent:30 Minutes Time spent includes review of chart, medications, test results, and follow up plan with the patient.   [place] Controlled Substance Database was reviewed by me.  This patient was seen by Jonetta Osgood, FNP-C in collaboration with Dr. Clayborn Bigness as a part of collaborative care agreement.   Romonia Yanik R. Valetta Fuller, MSN, FNP-C Internal medicine

## 2022-10-29 ENCOUNTER — Encounter: Payer: Self-pay | Admitting: Nurse Practitioner

## 2022-12-01 ENCOUNTER — Emergency Department
Admission: EM | Admit: 2022-12-01 | Discharge: 2022-12-02 | Disposition: A | Discharge status: Other Acute Inpatient Hospital | Payer: No Typology Code available for payment source | Attending: Emergency Medicine | Admitting: Emergency Medicine

## 2022-12-01 ENCOUNTER — Other Ambulatory Visit: Payer: Self-pay

## 2022-12-01 DIAGNOSIS — I10 Essential (primary) hypertension: Secondary | ICD-10-CM | POA: Diagnosis not present

## 2022-12-01 DIAGNOSIS — F331 Major depressive disorder, recurrent, moderate: Secondary | ICD-10-CM | POA: Insufficient documentation

## 2022-12-01 DIAGNOSIS — F411 Generalized anxiety disorder: Secondary | ICD-10-CM | POA: Insufficient documentation

## 2022-12-01 DIAGNOSIS — G4700 Insomnia, unspecified: Secondary | ICD-10-CM | POA: Diagnosis not present

## 2022-12-01 DIAGNOSIS — Z20822 Contact with and (suspected) exposure to covid-19: Secondary | ICD-10-CM | POA: Insufficient documentation

## 2022-12-01 DIAGNOSIS — F17210 Nicotine dependence, cigarettes, uncomplicated: Secondary | ICD-10-CM | POA: Diagnosis not present

## 2022-12-01 DIAGNOSIS — R45851 Suicidal ideations: Secondary | ICD-10-CM | POA: Diagnosis not present

## 2022-12-01 DIAGNOSIS — E871 Hypo-osmolality and hyponatremia: Secondary | ICD-10-CM

## 2022-12-01 LAB — URINE DRUG SCREEN, QUALITATIVE (ARMC ONLY)
Amphetamines, Ur Screen: NOT DETECTED
Barbiturates, Ur Screen: NOT DETECTED
Benzodiazepine, Ur Scrn: POSITIVE — AB
Cannabinoid 50 Ng, Ur ~~LOC~~: NOT DETECTED
Cocaine Metabolite,Ur ~~LOC~~: NOT DETECTED
MDMA (Ecstasy)Ur Screen: NOT DETECTED
Methadone Scn, Ur: NOT DETECTED
Opiate, Ur Screen: NOT DETECTED
Phencyclidine (PCP) Ur S: NOT DETECTED
Tricyclic, Ur Screen: NOT DETECTED

## 2022-12-01 LAB — COMPREHENSIVE METABOLIC PANEL
ALT: 18 U/L (ref 0–44)
AST: 20 U/L (ref 15–41)
Albumin: 4.3 g/dL (ref 3.5–5.0)
Alkaline Phosphatase: 54 U/L (ref 38–126)
Anion gap: 9 (ref 5–15)
BUN: 28 mg/dL — ABNORMAL HIGH (ref 8–23)
CO2: 24 mmol/L (ref 22–32)
Calcium: 9.2 mg/dL (ref 8.9–10.3)
Chloride: 91 mmol/L — ABNORMAL LOW (ref 98–111)
Creatinine, Ser: 1.4 mg/dL — ABNORMAL HIGH (ref 0.61–1.24)
GFR, Estimated: 57 mL/min — ABNORMAL LOW (ref >60)
Glucose, Bld: 185 mg/dL — ABNORMAL HIGH (ref 70–99)
Potassium: 3 mmol/L — ABNORMAL LOW (ref 3.5–5.1)
Sodium: 124 mmol/L — ABNORMAL LOW (ref 135–145)
Total Bilirubin: 0.5 mg/dL (ref 0.3–1.2)
Total Protein: 7.8 g/dL (ref 6.5–8.1)

## 2022-12-01 LAB — CBC
HCT: 38.7 % — ABNORMAL LOW (ref 39.0–52.0)
Hemoglobin: 13.9 g/dL (ref 13.0–17.0)
MCH: 31 pg (ref 26.0–34.0)
MCHC: 35.9 g/dL (ref 30.0–36.0)
MCV: 86.4 fL (ref 80.0–100.0)
Platelets: 450 10*3/uL — ABNORMAL HIGH (ref 150–400)
RBC: 4.48 MIL/uL (ref 4.22–5.81)
RDW: 12.3 % (ref 11.5–15.5)
WBC: 9.8 10*3/uL (ref 4.0–10.5)
nRBC: 0 % (ref 0.0–0.2)

## 2022-12-01 LAB — SALICYLATE LEVEL: Salicylate Lvl: <7 mg/dL — ABNORMAL LOW (ref 7.0–30.0)

## 2022-12-01 LAB — BASIC METABOLIC PANEL
Anion gap: 8 (ref 5–15)
BUN: 26 mg/dL — ABNORMAL HIGH (ref 8–23)
CO2: 23 mmol/L (ref 22–32)
Calcium: 9 mg/dL (ref 8.9–10.3)
Chloride: 99 mmol/L (ref 98–111)
Creatinine, Ser: 1.36 mg/dL — ABNORMAL HIGH (ref 0.61–1.24)
GFR, Estimated: 59 mL/min — ABNORMAL LOW (ref >60)
Glucose, Bld: 161 mg/dL — ABNORMAL HIGH (ref 70–99)
Potassium: 3.3 mmol/L — ABNORMAL LOW (ref 3.5–5.1)
Sodium: 130 mmol/L — ABNORMAL LOW (ref 135–145)

## 2022-12-01 LAB — RESP PANEL BY RT-PCR (FLU A&B, COVID) ARPGX2
Influenza A by PCR: NEGATIVE
Influenza B by PCR: NEGATIVE
SARS Coronavirus 2 by RT PCR: NEGATIVE

## 2022-12-01 LAB — ACETAMINOPHEN LEVEL: Acetaminophen (Tylenol), Serum: <10 ug/mL — ABNORMAL LOW (ref 10–30)

## 2022-12-01 LAB — ETHANOL: Alcohol, Ethyl (B): <10 mg/dL (ref <10)

## 2022-12-01 MED ORDER — SODIUM CHLORIDE 0.9 % IV BOLUS
1000.0000 mL | Freq: Once | INTRAVENOUS | Status: AC
  Administered 2022-12-01: 1000 mL via INTRAVENOUS

## 2022-12-01 NOTE — ED Triage Notes (Addendum)
Pt in via EMS from home with c/o psych eval. EMS reports pts neighbor called because he has been texting his neighbor over the last few days stating "I don't want to be here anymore", "I don't know why I am here". Pt neighbor reports pt took some sleeping pills. EMS reports pt denied taking pills at first and then he said he took one and then he said he took 3. The pills are Zolpidem. EMS reports unsure what he took. Pt with hx of depression and anxiety. Pt reports he did not take the pills to kill himself but also reports he does not want to wake up in the morning. EMS reports pt wrote a living will on a piece of paper beside his bed stating that if he died he wanted ashed spread. EMS reports there was also a second note in the kitchen that stated "its just a mental illness, get over it".

## 2022-12-01 NOTE — BH Assessment (Signed)
Referral information for Psychiatric Hospitalization faxed to;  [facility] [phone]- [phone]),   [facility] [phone]),  [address] [phone]),   [place] [phone] -or- [phone]),   [facility] [phone])  [facility] [phone])

## 2022-12-01 NOTE — BH Assessment (Signed)
PATIENT BED AVAILABLE AFTER 11AM ON 12/02/22  Patient has been accepted to [facility].  Accepting physician is Dr. Luretha Rued.  Call report to [phone].  Representative was [employer].   ER Staff is aware of it:  [facility] ER Secretary  Dr. Jori Moll, ER MD  [facility] Patient's Nurse

## 2022-12-01 NOTE — BH Assessment (Signed)
Comprehensive Clinical Assessment (CCA) Note  12/01/2022 Dustin Heath [id number]  Chief Complaint: Patient is a 62 year old male presenting to [facility] ED under IVC. Per triage note Pt in via EMS from home with c/o psych eval. EMS reports pts neighbor called because he has been texting his neighbor over the last few days stating "I don't want to be here anymore", "I don't know why I am here". Pt neighbor reports pt took some sleeping pills. EMS reports pt denied taking pills at first and then he said he took one and then he said he took 3. The pills are Zolpidem. EMS reports unsure what he took. Pt with hx of depression and anxiety. Pt reports he did not take the pills to kill himself but also reports he does not want to wake up in the morning. EMS reports pt wrote a living will on a piece of paper beside his bed stating that if he died he wanted ashed spread. EMS reports there was also a second note in the kitchen that stated "its just a mental illness, get over it".  During assessment patient appears alert and oriented x4, calm and cooperative. Patient reports "I bought a mattress and I've been having trouble sleeping, I took a little extra sleeping pills and my landlord was concerned, I used to have a mobile home." Patient denies a current therapist or psychiatrist and reports an attempt "20 years ago" but cannot recall what he did. Patient denies HI/AH/VH.  Per Psyc NP Anette Riedel patient is recommended for Inpatient Chief Complaint  Patient presents with   Suicidal   Visit Diagnosis: Major Depressive Disorder, recurrent episode, moderate    CCA Screening, Triage and Referral (STR)  Patient Reported Information How did you hear about [place]? Legal System  Referral name: No data recorded Referral phone number: No data recorded  Whom do you see for routine medical problems? No data recorded Practice/Facility Name: No data recorded Practice/Facility Phone Number: No data recorded Name of Contact:  No data recorded Contact Number: No data recorded Contact Fax Number: No data recorded Prescriber Name: No data recorded Prescriber Address (if known): No data recorded  What Is the Reason for Your Visit/Call Today? Pt in via EMS from home with c/o psych eval. EMS reports pts neighbor called because he has been texting his neighbor over the last few days stating "I don't want to be here anymore", "I don't know why I am here". Pt neighbor reports pt took some sleeping pills. EMS reports pt denied taking pills at first and then he said he took one and then he said he took 3. The pills are Zolpidem. EMS reports unsure what he took. Pt with hx of depression and anxiety. Pt reports he did not take the pills to kill himself but also reports he does not want to wake up in the morning. EMS reports pt wrote a living will on a piece of paper beside his bed stating that if he died he wanted ashed spread. EMS reports there was also a second note in the kitchen that stated "its just a mental illness, get over it".  How Long Has This Been Causing You Problems? > than 6 months  What Do You Feel Would Help You the Most Today? Treatment for Depression or other mood problem   Have You Recently Been in Any Inpatient Treatment (Hospital/Detox/Crisis Center/28-Day Program)? No data recorded Name/Location of Program/Hospital:No data recorded How Long Were You There? No data recorded When Were You Discharged?  No data recorded  Have You Ever Received Services From [facility] Before? No data recorded Who Do You See at [facility]? No data recorded  Have You Recently Had Any Thoughts About Hurting Yourself? No  Are You Planning to Commit Suicide/Harm Yourself At This time? No   Have you Recently Had Thoughts About [place]? No  Explanation: No data recorded  Have You Used Any Alcohol or Drugs in the Past 24 Hours? No  How Long Ago Did You Use Drugs or Alcohol? No data recorded What Did You Use  and How Much? No data recorded  Do You Currently Have a Therapist/Psychiatrist? No  Name of Therapist/Psychiatrist: No data recorded  Have You Been Recently Discharged From Any Office Practice or Programs? No  Explanation of Discharge From Practice/Program: No data recorded    CCA Screening Triage Referral Assessment Type of Contact: Face-to-Face  Is this Initial or Reassessment? No data recorded Date Telepsych consult ordered in [facility]:  No data recorded Time Telepsych consult ordered in [facility]:  No data recorded  Patient Reported Information Reviewed? No data recorded Patient Left Without Being Seen? No data recorded Reason for Not Completing Assessment: No data recorded  Collateral Involvement: No data recorded  Does Patient Have a [place]? No data recorded Name and Contact of Legal Guardian: No data recorded If Minor and Not Living with Parent(s), Who has Custody? No data recorded Is CPS involved or ever been involved? Never  Is APS involved or ever been involved? Never   Patient Determined To Be At Risk for Harm To Self or Others Based on Review of Patient Reported Information or Presenting Complaint? No data recorded Method: No data recorded Availability of Means: No data recorded Intent: No data recorded Notification Required: No data recorded Additional Information for Danger to Others Potential: No data recorded Additional Comments for Danger to Others Potential: No data recorded Are There Guns or Other Weapons in Your Home? No  Types of Guns/Weapons: No data recorded Are These Weapons Safely Secured?                            No  Who Could Verify You Are Able To Have These Secured: No data recorded Do You Have any Outstanding Charges, Pending Court Dates, Parole/Probation? No data recorded Contacted To Inform of Risk of Harm To Self or Others: No data recorded  Location of Assessment: [facility] ED   Does Patient Present under Involuntary  Commitment? No  IVC Papers Initial File Date: No data recorded  [place] of Residence:    Patient Currently Receiving the Following Services: No data recorded  Determination of Need: Emergent (2 hours)   Options For Referral: No data recorded    CCA Biopsychosocial Intake/Chief Complaint:  No data recorded Current Symptoms/Problems: No data recorded  Patient Reported Schizophrenia/Schizoaffective Diagnosis in Past: No   Strengths: Patient is able to communicate his needs  Preferences: No data recorded Abilities: No data recorded  Type of Services Patient Feels are Needed: No data recorded  Initial Clinical Notes/Concerns: No data recorded  Mental Health Symptoms Depression:   Change in energy/activity; Hopelessness; Worthlessness   Duration of Depressive symptoms:  Greater than two weeks   Mania:   None   Anxiety:    Worrying   Psychosis:   None   Duration of Psychotic symptoms: No data recorded  Trauma:   None   Obsessions:   None  Compulsions:   None   Inattention:   None   Hyperactivity/Impulsivity:   None   Oppositional/Defiant Behaviors:   None   Emotional Irregularity:   None   Other Mood/Personality Symptoms:  No data recorded   Mental Status Exam Appearance and self-care  Stature:   Average   Weight:   Average weight   Clothing:   Casual   Grooming:   Normal   Cosmetic use:   None   Posture/gait:   Normal   Motor activity:   Not Remarkable   Sensorium  Attention:   Normal   Concentration:   Normal   Orientation:   X5   Recall/memory:   Normal   Affect and Mood  Affect:   Appropriate   Mood:   Depressed   Relating  Eye contact:   Normal   Facial expression:   Responsive   Attitude toward examiner:   Cooperative   Thought and Language  Speech flow:  Clear and Coherent   Thought content:   Appropriate to Mood and Circumstances   Preoccupation:   None   Hallucinations:    None   Organization:  No data recorded  [employer] of Knowledge:   Fair   Intelligence:   Average   Abstraction:   Normal   Judgement:   Fair   Art therapist:   Adequate   Insight:   Lacking   Decision Making:   Normal   Social Functioning  Social Maturity:   Isolates   Social Judgement:   Normal   Stress  Stressors:   Housing; Teacher, music Ability:   Exhausted   Skill Deficits:   None   Supports:   Support needed     Religion: Religion/Spirituality Are You A Religious Person?: No  Leisure/Recreation: Leisure / Recreation Do You Have Hobbies?: No  Exercise/Diet: Exercise/Diet Do You Exercise?: No Have You Gained or Lost A Significant Amount of Weight in the Past Six Months?: No Do You Follow a Special Diet?: No Do You Have Any Trouble Sleeping?: Yes Explanation of Sleeping Difficulties: Patient reports some difficulty sleeping   CCA Employment/Education Employment/Work Situation: Employment / Work Situation Employment Situation: Unemployed Patient's Job has Been Impacted by Current Illness: No Has Patient ever Been in Passenger transport manager?: No  Education: Education Is Patient Currently Attending School?: No Did Physicist, medical?: No Did You Have An Individualized Education Program (IIEP): No Did You Have Any Difficulty At [employer]?: No Patient's Education Has Been Impacted by Current Illness: No   CCA Family/Childhood History Family and Relationship History: Family history Marital status: Single Does patient have children?: No  Childhood History:  Childhood History Did patient suffer any verbal/emotional/physical/sexual abuse as a child?: No Did patient suffer from severe childhood neglect?: No Has patient ever been sexually abused/assaulted/raped as an adolescent or adult?: No Was the patient ever a victim of a crime or a disaster?: No Witnessed domestic violence?: No Has patient been affected by domestic  violence as an adult?: No  Child/Adolescent Assessment:     CCA Substance Use Alcohol/Drug Use: Alcohol / Drug Use Pain Medications: See MAR Prescriptions: See MAR Over the Counter: See MAR History of alcohol / drug use?: No history of alcohol / drug abuse                         ASAM's:  Six Dimensions of Multidimensional Assessment  Dimension 1:  Acute Intoxication and/or Withdrawal Potential:  Dimension 2:  Biomedical Conditions and Complications:      Dimension 3:  Emotional, Behavioral, or Cognitive Conditions and Complications:     Dimension 4:  Readiness to Change:     Dimension 5:  Relapse, Continued use, or Continued Problem Potential:     Dimension 6:  Recovery/Living Environment:     ASAM Severity Score:    ASAM Recommended Level of Treatment:     Substance use Disorder (SUD)    Recommendations for Services/Supports/Treatments:    DSM5 Diagnoses: Patient Active Problem List   Diagnosis Date Noted   Verbalizes suicidal thoughts 12/01/2022   OSA on CPAP 05/28/2021   Moderate episode of recurrent major depressive disorder ([place]) 05/28/2021   Acquired deviated nasal septum 11/17/2020   Encounter for long-term (current) use of medications 11/17/2020   Dorsalgia of lumbosacral region 10/22/2019   Generalized anxiety disorder 06/10/2018   Essential (primary) hypertension 01/09/2018   Mixed hyperlipidemia 01/09/2018   Insomnia 01/09/2018   Nicotine dependence, cigarettes, uncomplicated 67/34/1937   Allergic rhinitis, unspecified 01/09/2018    Patient Centered Plan: Patient is on the following Treatment Plan(s):  Depression   Referrals to Alternative Service(s): Referred to Alternative Service(s):   Place:   Date:   Time:    Referred to Alternative Service(s):   Place:   Date:   Time:    Referred to Alternative Service(s):   Place:   Date:   Time:    Referred to Alternative Service(s):   Place:   Date:   Time:       '@[facility]'$ @  [employer], LCAS-A

## 2022-12-01 NOTE — ED Provider Notes (Addendum)
[facility] Provider Note    Event Date/Time   First MD Initiated Contact with Patient 12/01/22 1726     (approximate)   History   Suicidal   HPI  Dustin Heath is a 62 y.o. male who presents to the emergency department after taking an extra sleeping pill.  Patient denies any suicidal ideation.  States that he is not been sleeping well for the past couple of weeks.  Believes that it is secondary to a new mattress.  States that he was texting his landlord today and his landlord came by and ultimately called EMS to have him come to the emergency department.  Patient had texted him that he did not feel like living anymore and did not have the will to live".  When EMS arrived they found handwritten notes of his will written on the table.  Patient denies any suicidal thoughts at this time.  Does endorse taking an extra sleeping pill.  States that he has been thinking recently about how he lost his mobile home which he used to have that was very nice.  Denies any drug or alcohol use.  Endorses tobacco use.     Physical Exam   Triage Vital Signs: ED Triage Vitals  Enc Vitals Group     BP 12/01/22 1707 103/64     Pulse Rate 12/01/22 1707 95     Resp 12/01/22 1707 18     Temp 12/01/22 1707 98.4 F (36.9 C)     Temp Source 12/01/22 1707 Oral     SpO2 12/01/22 1707 95 %     Weight 12/01/22 1704 215 lb (97.5 kg)     Height 12/01/22 1704 '5\' 7"'$  (1.702 m)     Head Circumference --      Peak Flow --      Pain Score 12/01/22 1703 0     Pain Loc --      Pain Edu? --      Excl. in [place]? --     Most recent vital signs: Vitals:   12/01/22 1707 12/01/22 1941  BP: 103/64 121/68  Pulse: 95 80  Resp: 18 18  Temp: 98.4 F (36.9 C) 97.9 F (36.6 C)  SpO2: 95% 98%    Physical Exam Constitutional:      Appearance: He is well-developed.  HENT:     Head: Atraumatic.  Eyes:     Conjunctiva/sclera: Conjunctivae normal.  Cardiovascular:     Rate and Rhythm: Regular  rhythm.  Pulmonary:     Effort: No respiratory distress.  Musculoskeletal:     Cervical back: Normal range of motion.  Skin:    General: Skin is warm.  Neurological:     Mental Status: He is alert. Mental status is at baseline.  Psychiatric:        Mood and Affect: Mood is depressed.        Speech: Speech normal.        Behavior: Behavior is withdrawn.        Thought Content: Thought content does not include suicidal ideation. Thought content does not include suicidal plan.        Cognition and Memory: Cognition normal.        Judgment: Judgment is impulsive.          IMPRESSION / MDM / ASSESSMENT AND PLAN / ED COURSE  I reviewed the triage vital signs and the nursing notes.  Differential diagnosis including suicidal thoughts, overdose on sleeping medication, electrolyte abnormalities,  dehydration      ED Results / Procedures / Treatments   Labs (all labs ordered are listed, but only abnormal results are displayed) Labs interpreted as -   Initially sodium came back at 124 with a baseline of in the 130s.  No signs of coingestion.  No other significant electrolyte abnormalities. Labs Reviewed  COMPREHENSIVE METABOLIC PANEL - Abnormal; Notable for the following components:      Result Value   Sodium 124 (*)    Potassium 3.0 (*)    Chloride 91 (*)    Glucose, Bld 185 (*)    BUN 28 (*)    Creatinine, Ser 1.40 (*)    GFR, Estimated 57 (*)    All other components within normal limits  SALICYLATE LEVEL - Abnormal; Notable for the following components:   Salicylate Lvl <1.7 (*)    All other components within normal limits  ACETAMINOPHEN LEVEL - Abnormal; Notable for the following components:   Acetaminophen (Tylenol), Serum <10 (*)    All other components within normal limits  CBC - Abnormal; Notable for the following components:   HCT 38.7 (*)    Platelets 450 (*)    All other components within normal limits  BASIC METABOLIC PANEL - Abnormal; Notable for the  following components:   Sodium 130 (*)    Potassium 3.3 (*)    Glucose, Bld 161 (*)    BUN 26 (*)    Creatinine, Ser 1.36 (*)    GFR, Estimated 59 (*)    All other components within normal limits  RESP PANEL BY RT-PCR (FLU A&B, COVID) [id number]  ETHANOL  URINE DRUG SCREEN, QUALITATIVE ([facility] ONLY)   Treated with 1 L normal saline.  Repeat lab work with sodium that has improved to 130.  No significant altered mental status.  Do not feel that the patient needs admission for hyponatremia and do not feel that this is the etiology of his suicidal thoughts or thoughts of depression.  Given multiple high risk features for suicide patient was IVC need for evaluation by psychiatry.  Patient medically cleared.  Psychiatry evaluated the patient the emergency department, recommended inpatient psychiatric admission.  The patient has been placed in psychiatric observation due to the need to provide a safe environment for the patient while obtaining psychiatric consultation and evaluation, as well as ongoing medical and medication management to treat the patient's condition.  The patient has been placed under full IVC at this time.    PROCEDURES:  Critical Care performed: no  Procedures  Patient's presentation is most consistent with acute presentation with potential threat to life or bodily function.   MEDICATIONS ORDERED IN ED: Medications  sodium chloride 0.9 % bolus 1,000 mL (0 mLs Intravenous Stopped 12/01/22 1952)    FINAL CLINICAL IMPRESSION(S) / ED DIAGNOSES   Final diagnoses:  Suicidal thoughts  Hyponatremia     Rx / DC Orders   ED Discharge Orders     None        Note:  This document was prepared using Dragon voice recognition software and may include unintentional dictation errors.   Nathaniel Man, MD 12/01/22 2107    Nathaniel Man, MD 12/01/22 2214

## 2022-12-01 NOTE — ED Notes (Signed)
Pt is eating dinner provided

## 2022-12-01 NOTE — ED Triage Notes (Addendum)
See first nurse note for EMS report- pt states he has not been sleeping well for the last few weeks- pt states he lost his home in [place]- pt denies SI or HI to this RN- pt states he feels "so alone and it really gets to me sometimes"- pt states he took 1 sleeping pill last night and took 1 more in the middle of the night when he woke up- pt does have a flat affect

## 2022-12-01 NOTE — ED Notes (Signed)
Pt reporting that mattress on bed and seating on recliner are not comfortable. Beth EDT repeatedly swapping pts bed for recliner to attempt to satisfy pt. Pt remains unsatisfied.

## 2022-12-01 NOTE — Consult Note (Signed)
[facility] Psychiatry Consult   Reason for Consult:  psychiatric evaluation Referring Physician:  Dr. Jori Moll Patient Identification: Dustin Heath MRN:  [id number] Principal Diagnosis: Rosezena Sensor suicidal thoughts Diagnosis:  Principal Problem:   Verbalizes suicidal thoughts Active Problems:   Insomnia   Generalized anxiety disorder   Moderate episode of recurrent major depressive disorder ([place])   Total Time spent with patient: 45 minutes  Subjective:   " I was having trouble sleeping"  HPI:  Dustin Heath, 62 y.o., male patient seen by TTS and this provider; chart reviewed and consulted with Dr. Dwyane Dee on 12/01/22.  Per chart review, triage note states, Pt in via EMS from home with c/o psych eval. EMS reports pts neighbor called because he has been texting his neighbor over the last few days stating "I don't want to be here anymore", "I don't know why I am here". Pt neighbor reports pt took some sleeping pills. EMS reports pt denied taking pills at first and then he said he took one and then he said he took 3. The pills are Zolpidem. EMS reports unsure what he took. Pt with hx of depression and anxiety. Pt reports he did not take the pills to kill himself but also reports he does not want to wake up in the morning. EMS reports pt wrote a living will on a piece of paper beside his bed stating that if he died he wanted ashed spread. EMS reports there was also a second note in the kitchen that stated "its just a mental illness, get over it".    During evaluation Dustin Heath is awake laying on bed in the hall;  he is complaining that he is uncomfortable.  He is alert and engaging in the assessment. He is oriented x 4; depressed/cooperative; with flat affect. When asked what brings him here, he minimizes his depression and states that he just want to go home.  However, he reveals feeling really down for the past two weeks.  Feelings of loneliness and worthless are always present  since he lost his house.  He feels that since losing his house his life has continued to go down hill. He endorses past SA but says he cannot remember what he did.  When asked why he decided to write a will out before taking his sleeping pills, he states, "shouldn't everyone have one".    The patient is at acute elevated risk of suicide and further worsening of psychiatric condition. Risk factors for suicide for this patient include: recent loss, current diagnosis of depression, hopelessness, suicidal thoughts and possible attempt leading to current admission.  Protective factors for this patient are limited.  The patient DOES meet [facility] involuntary commitment criteria at this time.   Past Psychiatric History: MDD, Past SA, GAD  Risk to Self:   Risk to Others:   Prior Inpatient Therapy:   Prior Outpatient Therapy:    Past Medical History:  Past Medical History:  Diagnosis Date   Depression    Dyspnea    occasional with exertion   Hyperlipidemia    Hypertension    controlled on meds   Insomnia    Nasal obstruction    left side nasal-severe restriction of air flow   Orthopnea    Panic disorder    Sleep apnea    moderate-no CPAP    Past Surgical History:  Procedure Laterality Date   APPENDECTOMY  62 yrs old   CATARACT EXTRACTION W/PHACO Right 05/11/2020   Procedure: CATARACT  EXTRACTION PHACO AND INTRAOCULAR LENS PLACEMENT (IOC) RIGHT 4.59 00:40.1 11.4%;  Surgeon: Leandrew Koyanagi, MD;  Location: [facility];  Service: Ophthalmology;  Laterality: Right;   CATARACT EXTRACTION W/PHACO Left 06/08/2020   Procedure: CATARACT EXTRACTION PHACO AND INTRAOCULAR LENS PLACEMENT (IOC) LEFT WITH ANTERIOR VITRECTOMY;  Surgeon: Leandrew Koyanagi, MD;  Location: [facility];  Service: Ophthalmology;  Laterality: Left;  4.47 0:35.8 12.4%   NOSE SURGERY  2000   Septorhinoplasty   Family History:  Family History  Problem Relation Age of Onset   Hypertension Mother     Hypertension Father    Family Psychiatric  History:  Social History:  Social History   Substance and Sexual Activity  Alcohol Use Never     Social History   Substance and Sexual Activity  Drug Use Never    Social History   Socioeconomic History   Marital status: Single    Spouse name: Not on file   Number of children: Not on file   Years of education: Not on file   Highest education level: Not on file  Occupational History   Not on file  Tobacco Use   Smoking status: Every Day    Packs/day: 1.00    Years: 40.00    Total pack years: 40.00    Types: Cigarettes    Last attempt to quit: 06/20/2021    Years since quitting: 1.4   Smokeless tobacco: Never  Vaping Use   Vaping Use: Never used  Substance and Sexual Activity   Alcohol use: Never   Drug use: Never   Sexual activity: Not on file  Other Topics Concern   Not on file  Social History Narrative   Not on file   Social Determinants of Health   Financial Resource Strain: Not on file  Food Insecurity: Not on file  Transportation Needs: Not on file  Physical Activity: Not on file  Stress: Not on file  Social Connections: Not on file   Additional Social History:    Allergies:  No Known Allergies  Labs:  Results for orders placed or performed during the hospital encounter of 12/01/22 (from the past 48 hour(s))  Comprehensive metabolic panel     Status: Abnormal   Collection Time: 12/01/22  5:07 PM  Result Value Ref Range   Sodium 124 (L) 135 - 145 mmol/L   Potassium 3.0 (L) 3.5 - 5.1 mmol/L   Chloride 91 (L) 98 - 111 mmol/L   CO2 24 22 - 32 mmol/L   Glucose, Bld 185 (H) 70 - 99 mg/dL    Comment: Glucose reference range applies only to samples taken after fasting for at least 8 hours.   BUN 28 (H) 8 - 23 mg/dL   Creatinine, Ser 1.40 (H) 0.61 - 1.24 mg/dL   Calcium 9.2 8.9 - 10.3 mg/dL   Total Protein 7.8 6.5 - 8.1 g/dL   Albumin 4.3 3.5 - 5.0 g/dL   AST 20 15 - 41 U/L   ALT 18 0 - 44 U/L   Alkaline  Phosphatase 54 38 - 126 U/L   Total Bilirubin 0.5 0.3 - 1.2 mg/dL   GFR, Estimated 57 (L) >60 mL/min    Comment: (NOTE) Calculated using the CKD-EPI Creatinine Equation (2021)    Anion gap 9 5 - 15    Comment: Performed at [facility], [address] [place], [place] [address]  Ethanol     Status: None   Collection Time: 12/01/22  5:07 PM  Result Value  Ref Range   Alcohol, Ethyl (B) <10 <10 mg/dL    Comment: (NOTE) Lowest detectable limit for serum alcohol is 10 mg/dL.  For medical purposes only. Performed at [facility], [address] [place], [place] [address]   Salicylate level     Status: Abnormal   Collection Time: 12/01/22  5:07 PM  Result Value Ref Range   Salicylate Lvl <2.6 (L) 7.0 - 30.0 mg/dL    Comment: Performed at [facility], [address] [place], [place] [address]  Acetaminophen level     Status: Abnormal   Collection Time: 12/01/22  5:07 PM  Result Value Ref Range   Acetaminophen (Tylenol), Serum <10 (L) 10 - 30 ug/mL    Comment: (NOTE) Therapeutic concentrations vary significantly. A range of 10-30 ug/mL  may be an effective concentration for many patients. However, some  are best treated at concentrations outside of this range. Acetaminophen concentrations >150 ug/mL at 4 hours after ingestion  and >50 ug/mL at 12 hours after ingestion are often associated with  toxic reactions.  Performed at [facility], [address] [place], [place] [address]   cbc     Status: Abnormal   Collection Time: 12/01/22  5:07 PM  Result Value Ref Range   WBC 9.8 4.0 - 10.5 K/uL   RBC 4.48 4.22 - 5.81 MIL/uL   Hemoglobin 13.9 13.0 - 17.0 g/dL   HCT 38.7 (L) 39.0 - 52.0 %   MCV 86.4 80.0 - 100.0 fL   MCH 31.0 26.0 - 34.0 pg   MCHC 35.9 30.0 - 36.0 g/dL   RDW 12.3 11.5 - 15.5 %   Platelets 450 (H) 150 - 400 K/uL   nRBC 0.0 0.0 - 0.2 %    Comment: Performed at [facility], [address] [place], [place]  [address]  Resp Panel by RT-PCR (Flu A&B, Covid) Anterior Nasal Swab     Status: None   Collection Time: 12/01/22  7:53 PM   Specimen: Anterior Nasal Swab  Result Value Ref Range   SARS Coronavirus 2 by RT PCR NEGATIVE NEGATIVE    Comment: (NOTE) SARS-CoV-2 target nucleic acids are NOT DETECTED.  The SARS-CoV-2 RNA is generally detectable in upper respiratory specimens during the acute phase of infection. The lowest concentration of SARS-CoV-2 viral copies this assay can detect is 138 copies/mL. A negative result does not preclude SARS-Cov-2 infection and should not be used as the sole basis for treatment or other patient management decisions. A negative result may occur with  improper specimen collection/handling, submission of specimen other than nasopharyngeal swab, presence of viral mutation(s) within the areas targeted by this assay, and inadequate number of viral copies(<138 copies/mL). A negative result must be combined with clinical observations, patient history, and epidemiological information. The expected result is Negative.  Fact Sheet for Patients:  EntrepreneurPulse.com.au  Fact Sheet for Healthcare Providers:  IncredibleEmployment.be  This test is no t yet approved or cleared by the [place] FDA and  has been authorized for detection and/or diagnosis of SARS-CoV-2 by FDA under an Emergency Use Authorization (EUA). This EUA will remain  in effect (meaning this test can be used) for the duration of the COVID-19 declaration under Section 564(b)(1) of the Act, 21 U.S.C.section 360bbb-3(b)(1), unless the authorization is terminated  or revoked sooner.       Influenza A by PCR NEGATIVE NEGATIVE   Influenza B by PCR NEGATIVE NEGATIVE    Comment: (NOTE) The Xpert Xpress SARS-CoV-2/FLU/RSV  plus assay is intended as an aid in the diagnosis of influenza from Nasopharyngeal swab specimens and should not be used as a sole basis for  treatment. Nasal washings and aspirates are unacceptable for Xpert Xpress SARS-CoV-2/FLU/RSV testing.  Fact Sheet for Patients: EntrepreneurPulse.com.au  Fact Sheet for Healthcare Providers: IncredibleEmployment.be  This test is not yet approved or cleared by the [place] FDA and has been authorized for detection and/or diagnosis of SARS-CoV-2 by FDA under an Emergency Use Authorization (EUA). This EUA will remain in effect (meaning this test can be used) for the duration of the COVID-19 declaration under Section 564(b)(1) of the Act, 21 U.S.C. section 360bbb-3(b)(1), unless the authorization is terminated or revoked.  Performed at [facility], [address] [place], [place] [address]   Basic metabolic panel     Status: Abnormal   Collection Time: 12/01/22  7:53 PM  Result Value Ref Range   Sodium 130 (L) 135 - 145 mmol/L   Potassium 3.3 (L) 3.5 - 5.1 mmol/L   Chloride 99 98 - 111 mmol/L   CO2 23 22 - 32 mmol/L   Glucose, Bld 161 (H) 70 - 99 mg/dL    Comment: Glucose reference range applies only to samples taken after fasting for at least 8 hours.   BUN 26 (H) 8 - 23 mg/dL   Creatinine, Ser 1.36 (H) 0.61 - 1.24 mg/dL   Calcium 9.0 8.9 - 10.3 mg/dL   GFR, Estimated 59 (L) >60 mL/min    Comment: (NOTE) Calculated using the CKD-EPI Creatinine Equation (2021)    Anion gap 8 5 - 15    Comment: Performed at [facility], [address] [place], [place] [address]  Urine Drug Screen, Qualitative     Status: Abnormal   Collection Time: 12/01/22  8:40 PM  Result Value Ref Range   Tricyclic, Ur Screen NONE DETECTED NONE DETECTED   Amphetamines, Ur Screen NONE DETECTED NONE DETECTED   MDMA (Ecstasy)Ur Screen NONE DETECTED NONE DETECTED   Cocaine Metabolite,Ur [place] NONE DETECTED NONE DETECTED   Opiate, Ur Screen NONE DETECTED NONE DETECTED   Phencyclidine (PCP) Ur S NONE DETECTED NONE DETECTED   Cannabinoid 50 Ng, Ur [place]  NONE DETECTED NONE DETECTED   Barbiturates, Ur Screen NONE DETECTED NONE DETECTED   Benzodiazepine, Ur Scrn POSITIVE (A) NONE DETECTED   Methadone Scn, Ur NONE DETECTED NONE DETECTED    Comment: (NOTE) Tricyclics + metabolites, urine    Cutoff 1000 ng/mL Amphetamines + metabolites, urine  Cutoff 1000 ng/mL MDMA (Ecstasy), urine              Cutoff 500 ng/mL Cocaine Metabolite, urine          Cutoff 300 ng/mL Opiate + metabolites, urine        Cutoff 300 ng/mL Phencyclidine (PCP), urine         Cutoff 25 ng/mL Cannabinoid, urine                 Cutoff 50 ng/mL Barbiturates + metabolites, urine  Cutoff 200 ng/mL Benzodiazepine, urine              Cutoff 200 ng/mL Methadone, urine                   Cutoff 300 ng/mL  The urine drug screen provides only a preliminary, unconfirmed analytical test result and should not be used for non-medical purposes. Clinical consideration and professional judgment should be applied to any positive drug screen result  due to possible interfering substances. A more specific alternate chemical method must be used in order to obtain a confirmed analytical result. Gas chromatography / mass spectrometry (GC/MS) is the preferred confirm atory method. Performed at [facility], [address] [place], [place] [address]     No current facility-administered medications for this encounter.   Current Outpatient Medications  Medication Sig Dispense Refill   ALPRAZolam (XANAX) 0.5 MG tablet Take 1-1.5 tablets (0.5-0.75 mg total) by mouth at bedtime as needed for anxiety or sleep. 45 tablet 2   amLODipine (NORVASC) 10 MG tablet Take 1 tablet (10 mg total) by mouth daily. 90 tablet 1   aspirin EC 81 MG tablet Take 81 mg by mouth daily.     atorvastatin (LIPITOR) 40 MG tablet TAKE 1 TABLET (40 MG TOTAL) BY MOUTH DAILY. 90 tablet 3   Cholecalciferol (VITAMIN D-3) 25 MCG (1000 UT) CAPS Take by mouth.     citalopram (CELEXA) 40 MG tablet TAKE ONE TAB BY MOUTH  DAILY FOR GENERALIZED ANXIETY DISORDER 90 tablet 1   cyclobenzaprine (FLEXERIL) 5 MG tablet Take 1 tablet (5 mg total) by mouth at bedtime as needed for muscle spasms. 90 tablet 1   fluticasone (FLONASE) 50 MCG/ACT nasal spray Place into both nostrils daily.     Garlic 2774 MG CAPS Take by mouth.     lisinopril-hydrochlorothiazide (ZESTORETIC) 20-12.5 MG tablet Take 2 tablets by mouth daily. 180 tablet 2   Magnesium 250 MG TABS Take by mouth.     Multiple Vitamins-Minerals (CENTRUM SILVER 50+MEN) TABS Take by mouth.     tiZANidine (ZANAFLEX) 4 MG tablet Take 1 tablet (4 mg total) by mouth at bedtime. 30 tablet 2   vitamin E 180 MG (400 UNITS) capsule Take 400 Units by mouth daily.     zolpidem (AMBIEN) 10 MG tablet Take 1 tablet (10 mg total) by mouth at bedtime. 30 tablet 2    Musculoskeletal: Strength & Muscle Tone: within normal limits Gait & Station: normal Patient leans: N/A  Psychiatric Specialty Exam:  Presentation  General Appearance:  Appropriate for Environment  Eye Contact: Fair  Speech: Clear and Coherent  Speech Volume: Normal  Handedness: Right   Mood and Affect  Mood: Depressed; Dysphoric; Hopeless; Worthless  Affect: Depressed; Flat   Thought Process  Thought Processes: Coherent  Descriptions of Associations:Intact  Orientation:Full (Time, Place and Person)  Thought Content:WDL  History of Schizophrenia/Schizoaffective disorder:No data recorded Duration of Psychotic Symptoms:No data recorded Hallucinations:Hallucinations: None  Ideas of Reference:None  Suicidal Thoughts:Suicidal Thoughts: Yes, Active SI Active Intent and/or Plan: With Intent; With Means to Carry Out  Homicidal Thoughts:Homicidal Thoughts: No   Sensorium  Memory: Recent Good  Judgment: Poor  Insight: Fair   Materials engineer: Fair  Attention Span: Fair  Recall: [employer] of Knowledge: Fair  Language: Fair   Psychomotor  Activity  Psychomotor Activity: Psychomotor Activity: Normal   Assets  Assets: Communication Skills; Housing   Sleep  Sleep: Sleep: Poor   Physical Exam: Physical Exam Vitals and nursing note reviewed.  HENT:     Head: Normocephalic and atraumatic.     Nose: Nose normal.     Mouth/Throat:     Mouth: Mucous membranes are dry.  Eyes:     Extraocular Movements: Extraocular movements intact.     Pupils: Pupils are equal, round, and reactive to light.  Pulmonary:     Effort: Pulmonary effort is normal.  Musculoskeletal:  General: Normal range of motion.     Cervical back: Normal range of motion.  Skin:    General: Skin is warm and dry.  Neurological:     Mental Status: He is alert and oriented to person, place, and time.  Psychiatric:        Attention and Perception: Attention and perception normal.        Mood and Affect: Mood is depressed. Affect is flat.        Speech: Speech normal.        Behavior: Behavior is cooperative.        Thought Content: Thought content includes suicidal ideation.        Cognition and Memory: Cognition and memory normal.        Judgment: Judgment is impulsive.    Review of Systems  Psychiatric/Behavioral:  Positive for depression and suicidal ideas. Negative for hallucinations and substance abuse. The patient is nervous/anxious and has insomnia.   All other systems reviewed and are negative.  Blood pressure 121/68, pulse 80, temperature 97.9 F (36.6 C), resp. rate 18, height '5\' 7"'$  (1.702 m), weight 97.5 kg, SpO2 98 %. Body mass index is 33.67 kg/m.  Treatment Plan Summary: Daily contact with patient to assess and evaluate symptoms and progress in treatment, Medication management, and Plan   Plan:  Review of chart, vital signs, medications, and notes. 1-Individual and group therapy 2-Medication management for depression and anxiety:  Medications reviewed with the patient and he stated no untoward effects, no changes  made 3-Coping skills for depression, anxiety, and PTSD 4-Continue crisis stabilization and management 5-Address health issues--monitoring vital signs, stable 6-Treatment plan in progress to prevent relapse of depression and anxiety  Disposition: Recommend psychiatric Inpatient admission when medically cleared. Supportive therapy provided about ongoing stressors. Discussed crisis plan, support from social network, calling 911, coming to the Emergency Department, and calling Suicide Hotline.  Deloria Lair, NP 12/01/2022 9:40 PM

## 2022-12-01 NOTE — ED Notes (Addendum)
Pt changed into hospital scrubs, belongings to include:  1 camo jacket 1 white t shirt 1 pair blue pajama pants 1 pair grey underwear 1 pair white socks 1 pair blue shoes 1 red duffle bag (containing clothes, cell phone, and charger) 1 grocery bag with pt medications 1 black billfold

## 2022-12-01 NOTE — ED Notes (Signed)
Pt denies SI/HI. States that he has not been sleeping for last 2-3 weeks, ever since he bought a new mattress and he has been taking extra ambien to try to sleep. His landlord came by today and thought pt did not look good and EMS was called. Per report, pt had written a will and it was on the bed beside pt. When asked about this pt responded that he thought it was a good time to write one, "everyone should have a will, right?". Pt says that he never thought about hurting himself. Pt with depressed affect.

## 2022-12-02 MED ORDER — AMLODIPINE BESYLATE 5 MG PO TABS
10.0000 mg | ORAL_TABLET | Freq: Every day | ORAL | Status: DC
  Administered 2022-12-02: 10 mg via ORAL
  Filled 2022-12-02: qty 2

## 2022-12-02 MED ORDER — HYDROCHLOROTHIAZIDE 12.5 MG PO TABS
12.5000 mg | ORAL_TABLET | Freq: Every day | ORAL | Status: DC
  Administered 2022-12-02: 12.5 mg via ORAL
  Filled 2022-12-02: qty 1

## 2022-12-02 MED ORDER — ALPRAZOLAM 0.5 MG PO TABS
0.5000 mg | ORAL_TABLET | Freq: Every evening | ORAL | Status: DC | PRN
  Administered 2022-12-02: 0.5 mg via ORAL
  Filled 2022-12-02: qty 1

## 2022-12-02 MED ORDER — LISINOPRIL 10 MG PO TABS
20.0000 mg | ORAL_TABLET | Freq: Every day | ORAL | Status: DC
  Administered 2022-12-02: 20 mg via ORAL
  Filled 2022-12-02: qty 2

## 2022-12-02 MED ORDER — CITALOPRAM HYDROBROMIDE 20 MG PO TABS
40.0000 mg | ORAL_TABLET | Freq: Every day | ORAL | Status: DC
  Administered 2022-12-02: 40 mg via ORAL
  Filled 2022-12-02: qty 2

## 2022-12-02 MED ORDER — ASPIRIN 81 MG PO TBEC
81.0000 mg | DELAYED_RELEASE_TABLET | Freq: Every day | ORAL | Status: DC
  Administered 2022-12-02: 81 mg via ORAL
  Filled 2022-12-02: qty 1

## 2022-12-02 MED ORDER — ZOLPIDEM TARTRATE 5 MG PO TABS
10.0000 mg | ORAL_TABLET | Freq: Every day | ORAL | Status: DC
  Administered 2022-12-02: 10 mg via ORAL
  Filled 2022-12-02: qty 2

## 2022-12-02 MED ORDER — LISINOPRIL-HYDROCHLOROTHIAZIDE 20-12.5 MG PO TABS: 2.0000 | ORAL_TABLET | Freq: Every day | ORAL | Status: DC

## 2022-12-02 MED ORDER — ATORVASTATIN CALCIUM 20 MG PO TABS
40.0000 mg | ORAL_TABLET | Freq: Every day | ORAL | Status: DC
  Administered 2022-12-02: 40 mg via ORAL
  Filled 2022-12-02: qty 2

## 2022-12-02 NOTE — ED Provider Notes (Signed)
Emergency Medicine Observation Re-evaluation Note  Calden Dorsey is a 62 y.o. male, seen on rounds today.  Pt initially presented to the ED for complaints of Suicidal  Currently, the patient is is no acute distress. Denies any concerns at this time.  Physical Exam  Blood pressure 121/68, pulse 80, temperature 97.9 F (36.6 C), resp. rate 18, height '5\' 7"'$  (1.702 m), weight 97.5 kg, SpO2 98 %.  Physical Exam: General: No apparent distress Pulm: Normal WOB Neuro: Moving all extremities Psych: Resting comfortably     ED Course / MDM     I have reviewed the labs performed to date as well as medications administered while in observation.  Recent changes in the last 24 hours include: No acute events overnight.  Plan   Current plan: Patient awaiting psychiatric disposition.  Recommend inpatient psychiatric treatment. Patient is under full IVC at this time.    Kirston Luty, Delice Bison, DO 12/02/22 [phone]

## 2022-12-02 NOTE — ED Notes (Signed)
Pt using the phone at this time.

## 2022-12-02 NOTE — ED Notes (Signed)
Attempted to call report to [facility] at this time. Unable to take report at this time. Will call back momentarily

## 2022-12-02 NOTE — ED Notes (Signed)
Breakfast meal tray given at this time.  

## 2022-12-02 NOTE — ED Notes (Signed)
Psychiatric team at bedside

## 2022-12-20 ENCOUNTER — Other Ambulatory Visit: Payer: Self-pay | Admitting: Nurse Practitioner

## 2022-12-20 DIAGNOSIS — Z760 Encounter for issue of repeat prescription: Secondary | ICD-10-CM

## 2022-12-20 DIAGNOSIS — I10 Essential (primary) hypertension: Secondary | ICD-10-CM

## 2022-12-26 ENCOUNTER — Encounter: Payer: Self-pay | Admitting: Nurse Practitioner

## 2022-12-26 ENCOUNTER — Ambulatory Visit (INDEPENDENT_AMBULATORY_CARE_PROVIDER_SITE_OTHER): Payer: Medicaid Other | Admitting: Nurse Practitioner

## 2022-12-26 VITALS — BP 126/68 | HR 96 | Temp 98.4°F | Resp 16 | Ht 67.0 in | Wt 225.2 lb

## 2022-12-26 DIAGNOSIS — F411 Generalized anxiety disorder: Secondary | ICD-10-CM | POA: Diagnosis not present

## 2022-12-26 DIAGNOSIS — F5104 Psychophysiologic insomnia: Secondary | ICD-10-CM

## 2022-12-26 DIAGNOSIS — F331 Major depressive disorder, recurrent, moderate: Secondary | ICD-10-CM | POA: Diagnosis not present

## 2022-12-26 DIAGNOSIS — Z760 Encounter for issue of repeat prescription: Secondary | ICD-10-CM

## 2022-12-26 DIAGNOSIS — I10 Essential (primary) hypertension: Secondary | ICD-10-CM | POA: Diagnosis not present

## 2022-12-26 MED ORDER — ZOLPIDEM TARTRATE 10 MG PO TABS: 10.0000 mg | ORAL_TABLET | Freq: Every day | ORAL | 2 refills | Status: DC

## 2022-12-26 MED ORDER — ALPRAZOLAM 0.5 MG PO TABS: 0.5000 mg | ORAL_TABLET | Freq: Every evening | ORAL | 2 refills | Status: DC | PRN

## 2022-12-26 NOTE — Progress Notes (Cosign Needed)
[facility] [place], [place] [address]  Internal MEDICINE  Office Visit Note  Patient Name: Dustin Heath  [id number]  [id number]  Date of Service: 12/26/2022  Chief Complaint  Patient presents with   Follow-up    HPI Dustin Heath presents for a follow up visit for  Depression -- down not sleeping well, taking celexa 40 mg daily.  Anxiety -- prn alprazolam Insomnia -- new mattress with foam topper and taking ambien.     Current Medication: Outpatient Encounter Medications as of 12/26/2022  Medication Sig   amLODipine (NORVASC) 10 MG tablet TAKE 1 TABLET (10 MG TOTAL) BY MOUTH DAILY.   aspirin EC 81 MG tablet Take 81 mg by mouth daily.   atorvastatin (LIPITOR) 40 MG tablet TAKE 1 TABLET (40 MG TOTAL) BY MOUTH DAILY.   Cholecalciferol (VITAMIN D-3) 25 MCG (1000 UT) CAPS Take 1 capsule by mouth daily.   citalopram (CELEXA) 40 MG tablet TAKE ONE TAB BY MOUTH DAILY FOR GENERALIZED ANXIETY DISORDER   cyclobenzaprine (FLEXERIL) 5 MG tablet Take 1 tablet (5 mg total) by mouth at bedtime as needed for muscle spasms.   fluticasone (FLONASE) 50 MCG/ACT nasal spray Place 1 spray into both nostrils daily.   Garlic 7096 MG CAPS Take 1 capsule by mouth daily.   lisinopril-hydrochlorothiazide (ZESTORETIC) 20-12.5 MG tablet Take 2 tablets by mouth daily.   Magnesium 250 MG TABS Take 1 tablet by mouth daily.   Multiple Vitamins-Minerals (CENTRUM SILVER 50+MEN) TABS Take 1 tablet by mouth daily.   tiZANidine (ZANAFLEX) 4 MG tablet Take 1 tablet (4 mg total) by mouth at bedtime.   vitamin E 180 MG (400 UNITS) capsule Take 400 Units by mouth daily.   [DISCONTINUED] ALPRAZolam (XANAX) 0.5 MG tablet Take 1-1.5 tablets (0.5-0.75 mg total) by mouth at bedtime as needed for anxiety or sleep.   [DISCONTINUED] zolpidem (AMBIEN) 10 MG tablet Take 1 tablet (10 mg total) by mouth at bedtime.   ALPRAZolam (XANAX) 0.5 MG tablet Take 1-1.5 tablets (0.5-0.75 mg total) by mouth at bedtime as  needed for anxiety or sleep.   zolpidem (AMBIEN) 10 MG tablet Take 1 tablet (10 mg total) by mouth at bedtime.   No facility-administered encounter medications on file as of 12/26/2022.    Surgical History: Past Surgical History:  Procedure Laterality Date   APPENDECTOMY  62 yrs old   CATARACT EXTRACTION W/PHACO Right 05/11/2020   Procedure: CATARACT EXTRACTION PHACO AND INTRAOCULAR LENS PLACEMENT (IOC) RIGHT 4.59 00:40.1 11.4%;  Surgeon: Leandrew Koyanagi, MD;  Location: [facility];  Service: Ophthalmology;  Laterality: Right;   CATARACT EXTRACTION W/PHACO Left 06/08/2020   Procedure: CATARACT EXTRACTION PHACO AND INTRAOCULAR LENS PLACEMENT (IOC) LEFT WITH ANTERIOR VITRECTOMY;  Surgeon: Leandrew Koyanagi, MD;  Location: [facility];  Service: Ophthalmology;  Laterality: Left;  4.47 0:35.8 12.4%   NOSE SURGERY  2000   Septorhinoplasty    Medical History: Past Medical History:  Diagnosis Date   Depression    Dyspnea    occasional with exertion   Hyperlipidemia    Hypertension    controlled on meds   Insomnia    Nasal obstruction    left side nasal-severe restriction of air flow   Orthopnea    Panic disorder    Sleep apnea    moderate-no CPAP    Family History: Family History  Problem Relation Age of Onset   Hypertension Mother    Hypertension Father     Social History   Socioeconomic History  Marital status: Single    Spouse name: Not on file   Number of children: Not on file   Years of education: Not on file   Highest education level: Not on file  Occupational History   Not on file  Tobacco Use   Smoking status: Every Day    Packs/day: 1.00    Years: 40.00    Total pack years: 40.00    Types: Cigarettes    Last attempt to quit: 06/20/2021    Years since quitting: 1.5   Smokeless tobacco: Never  Vaping Use   Vaping Use: Never used  Substance and Sexual Activity   Alcohol use: Never   Drug use: Never   Sexual activity: Not on file   Other Topics Concern   Not on file  Social History Narrative   Not on file   Social Determinants of Health   Financial Resource Strain: Not on file  Food Insecurity: Not on file  Transportation Needs: Not on file  Physical Activity: Not on file  Stress: Not on file  Social Connections: Not on file  Intimate Partner Violence: Not on file      Review of Systems  Constitutional:  Negative for chills, fatigue and unexpected weight change.  HENT:  Negative for congestion, rhinorrhea, sneezing and sore throat.   Eyes:  Negative for redness.  Respiratory:  Negative for cough, chest tightness and shortness of breath.   Cardiovascular:  Negative for chest pain and palpitations.  Gastrointestinal:  Negative for abdominal pain, constipation, diarrhea, nausea and vomiting.  Genitourinary:  Negative for dysuria and frequency.  Musculoskeletal:  Negative for arthralgias, back pain, joint swelling and neck pain.  Skin:  Negative for rash.  Neurological: Negative.  Negative for tremors and numbness.  Hematological:  Negative for adenopathy. Does not bruise/bleed easily.  Psychiatric/Behavioral:  Negative for behavioral problems (Depression), sleep disturbance and suicidal ideas. The patient is not nervous/anxious.     Vital Signs: BP 126/68   Pulse 96   Temp 98.4 F (36.9 C)   Resp 16   Ht '5\' 7"'$  (1.702 m)   Wt 225 lb 3.2 oz (102.2 kg)   SpO2 98%   BMI 35.27 kg/m    Physical Exam Vitals reviewed.  Constitutional:      General: He is not in acute distress.    Appearance: Normal appearance. He is obese. He is not ill-appearing.  HENT:     Head: Normocephalic and atraumatic.  Eyes:     Pupils: Pupils are equal, round, and reactive to light.  Cardiovascular:     Rate and Rhythm: Normal rate and regular rhythm.  Pulmonary:     Effort: Pulmonary effort is normal. No respiratory distress.  Neurological:     Mental Status: He is alert and oriented to person, place, and time.   Psychiatric:        Mood and Affect: Mood normal.        Behavior: Behavior normal.        Assessment/Plan: 1. Essential (primary) hypertension Stable with current medictions  2. Moderate episode of recurrent major depressive disorder (HCC) Continue celexa as prescribed.   3. Psychophysiological insomnia Continue ambien as prescribed  4. Generalized anxiety disorder Continue alprazolam prn as prescribed  5. Medication refill - ALPRAZolam (XANAX) 0.5 MG tablet; Take 1-1.5 tablets (0.5-0.75 mg total) by mouth at bedtime as needed for anxiety or sleep.  Dispense: 45 tablet; Refill: 2 - zolpidem (AMBIEN) 10 MG tablet; Take 1 tablet (10 mg total)  by mouth at bedtime.  Dispense: 30 tablet; Refill: 2   General Counseling: Dustin Heath verbalizes understanding of the findings of todays visit and agrees with plan of treatment. I have discussed any further diagnostic evaluation that may be needed or ordered today. We also reviewed his medications today. he has been encouraged to call the office with any questions or concerns that should arise related to todays visit.    No orders of the defined types were placed in this encounter.   Meds ordered this encounter  Medications   ALPRAZolam (XANAX) 0.5 MG tablet    Sig: Take 1-1.5 tablets (0.5-0.75 mg total) by mouth at bedtime as needed for anxiety or sleep.    Dispense:  45 tablet    Refill:  2    Maximum Refills Reached   zolpidem (AMBIEN) 10 MG tablet    Sig: Take 1 tablet (10 mg total) by mouth at bedtime.    Dispense:  30 tablet    Refill:  2    Return in about 12 weeks (around 03/20/2023) for F/U, med refill, Alexismarie Flaim PCP and otherwise as needed. .   Total time spent:30 Minutes Time spent includes review of chart, medications, test results, and follow up plan with the patient.   [place] Controlled Substance Database was reviewed by me.  This patient was seen by Jonetta Osgood, FNP-C in collaboration with Dr. Clayborn Bigness as a part  of collaborative care agreement.   Dustin Heath R. Valetta Fuller, MSN, FNP-C Internal medicine

## 2022-12-28 ENCOUNTER — Encounter: Payer: Self-pay | Admitting: Nurse Practitioner

## 2023-01-24 ENCOUNTER — Ambulatory Visit: Payer: Medicaid Other | Admitting: Dermatology

## 2023-03-18 ENCOUNTER — Ambulatory Visit: Payer: Medicaid Other | Admitting: Nurse Practitioner

## 2023-03-18 ENCOUNTER — Encounter: Payer: Self-pay | Admitting: Nurse Practitioner

## 2023-03-18 VITALS — BP 104/61 | HR 87 | Temp 97.9°F | Resp 16 | Ht 67.0 in | Wt 212.0 lb

## 2023-03-18 DIAGNOSIS — M545 Low back pain, unspecified: Secondary | ICD-10-CM

## 2023-03-18 DIAGNOSIS — F411 Generalized anxiety disorder: Secondary | ICD-10-CM

## 2023-03-18 DIAGNOSIS — F5104 Psychophysiologic insomnia: Secondary | ICD-10-CM

## 2023-03-18 DIAGNOSIS — I10 Essential (primary) hypertension: Secondary | ICD-10-CM

## 2023-03-18 DIAGNOSIS — E782 Mixed hyperlipidemia: Secondary | ICD-10-CM

## 2023-03-18 MED ORDER — ZOLPIDEM TARTRATE 10 MG PO TABS: 10.0000 mg | ORAL_TABLET | Freq: Every day | ORAL | 2 refills | Status: DC

## 2023-03-18 MED ORDER — ALPRAZOLAM 0.5 MG PO TABS: 0.5000 mg | ORAL_TABLET | Freq: Two times a day (BID) | ORAL | 2 refills | Status: DC | PRN

## 2023-03-18 MED ORDER — TIZANIDINE HCL 4 MG PO TABS: 4.0000 mg | ORAL_TABLET | Freq: Every day | ORAL | 2 refills | Status: DC

## 2023-03-18 MED ORDER — ATORVASTATIN CALCIUM 40 MG PO TABS: 40.0000 mg | ORAL_TABLET | Freq: Every day | ORAL | 3 refills | Status: DC

## 2023-03-18 NOTE — Progress Notes (Signed)
[facility] [place], [place] [address]  Internal MEDICINE  Office Visit Note  Patient Name: Dustin Heath  [id number]  [id number]  Date of Service: 03/18/2023  Chief Complaint  Patient presents with   Depression   Hypertension   Hyperlipidemia   Follow-up    HPI Torren presents for a follow-up visit for insomnia, anxiety, and hypertension.  Insomnia -- poor sleep, ambien helps.  Anxiety -- takes alprazolam  Hypertension -- controlled, takes lisinopril-HCTZ.     Current Medication: Outpatient Encounter Medications as of 03/18/2023  Medication Sig   amLODipine (NORVASC) 10 MG tablet TAKE 1 TABLET (10 MG TOTAL) BY MOUTH DAILY.   aspirin EC 81 MG tablet Take 81 mg by mouth daily.   Cholecalciferol (VITAMIN D-3) 25 MCG (1000 UT) CAPS Take 1 capsule by mouth daily.   citalopram (CELEXA) 40 MG tablet TAKE ONE TAB BY MOUTH DAILY FOR GENERALIZED ANXIETY DISORDER   cyclobenzaprine (FLEXERIL) 5 MG tablet Take 1 tablet (5 mg total) by mouth at bedtime as needed for muscle spasms.   fluticasone (FLONASE) 50 MCG/ACT nasal spray Place 1 spray into both nostrils daily.   Garlic 123XX123 MG CAPS Take 1 capsule by mouth daily.   lisinopril-hydrochlorothiazide (ZESTORETIC) 20-12.5 MG tablet Take 2 tablets by mouth daily.   Magnesium 250 MG TABS Take 1 tablet by mouth daily.   Multiple Vitamins-Minerals (CENTRUM SILVER 50+MEN) TABS Take 1 tablet by mouth daily.   vitamin E 180 MG (400 UNITS) capsule Take 400 Units by mouth daily.   [DISCONTINUED] ALPRAZolam (XANAX) 0.5 MG tablet Take 1-1.5 tablets (0.5-0.75 mg total) by mouth at bedtime as needed for anxiety or sleep.   [DISCONTINUED] atorvastatin (LIPITOR) 40 MG tablet TAKE 1 TABLET (40 MG TOTAL) BY MOUTH DAILY.   [DISCONTINUED] tiZANidine (ZANAFLEX) 4 MG tablet Take 1 tablet (4 mg total) by mouth at bedtime.   [DISCONTINUED] zolpidem (AMBIEN) 10 MG tablet Take 1 tablet (10 mg total) by mouth at bedtime.   ALPRAZolam (XANAX)  0.5 MG tablet Take 1 tablet (0.5 mg total) by mouth 2 (two) times daily as needed for anxiety or sleep.   atorvastatin (LIPITOR) 40 MG tablet Take 1 tablet (40 mg total) by mouth daily.   tiZANidine (ZANAFLEX) 4 MG tablet Take 1 tablet (4 mg total) by mouth at bedtime.   zolpidem (AMBIEN) 10 MG tablet Take 1 tablet (10 mg total) by mouth at bedtime.   No facility-administered encounter medications on file as of 03/18/2023.    Surgical History: Past Surgical History:  Procedure Laterality Date   APPENDECTOMY  63 yrs old   CATARACT EXTRACTION W/PHACO Right 05/11/2020   Procedure: CATARACT EXTRACTION PHACO AND INTRAOCULAR LENS PLACEMENT (IOC) RIGHT 4.59 00:40.1 11.4%;  Surgeon: Leandrew Koyanagi, MD;  Location: [facility];  Service: Ophthalmology;  Laterality: Right;   CATARACT EXTRACTION W/PHACO Left 06/08/2020   Procedure: CATARACT EXTRACTION PHACO AND INTRAOCULAR LENS PLACEMENT (IOC) LEFT WITH ANTERIOR VITRECTOMY;  Surgeon: Leandrew Koyanagi, MD;  Location: [facility];  Service: Ophthalmology;  Laterality: Left;  4.47 0:35.8 12.4%   NOSE SURGERY  2000   Septorhinoplasty    Medical History: Past Medical History:  Diagnosis Date   Depression    Dyspnea    occasional with exertion   Hyperlipidemia    Hypertension    controlled on meds   Insomnia    Nasal obstruction    left side nasal-severe restriction of air flow   Orthopnea    Panic disorder  Sleep apnea    moderate-no CPAP    Family History: Family History  Problem Relation Age of Onset   Hypertension Mother    Hypertension Father     Social History   Socioeconomic History   Marital status: Single    Spouse name: Not on file   Number of children: Not on file   Years of education: Not on file   Highest education level: Not on file  Occupational History   Not on file  Tobacco Use   Smoking status: Every Day    Packs/day: 1.00    Years: 40.00    Additional pack years: 0.00    Total  pack years: 40.00    Types: Cigarettes    Last attempt to quit: 06/20/2021    Years since quitting: 1.7   Smokeless tobacco: Never  Vaping Use   Vaping Use: Never used  Substance and Sexual Activity   Alcohol use: Never   Drug use: Never   Sexual activity: Not on file  Other Topics Concern   Not on file  Social History Narrative   Not on file   Social Determinants of Health   Financial Resource Strain: Not on file  Food Insecurity: Not on file  Transportation Needs: Not on file  Physical Activity: Not on file  Stress: Not on file  Social Connections: Not on file  Intimate Partner Violence: Not on file      Review of Systems  Constitutional:  Negative for chills, fatigue and unexpected weight change.  HENT:  Negative for congestion, rhinorrhea, sneezing and sore throat.   Eyes:  Negative for redness.  Respiratory:  Negative for cough, chest tightness and shortness of breath.   Cardiovascular:  Negative for chest pain and palpitations.  Gastrointestinal:  Negative for abdominal pain, constipation, diarrhea, nausea and vomiting.  Genitourinary:  Negative for dysuria and frequency.  Musculoskeletal:  Negative for arthralgias, back pain, joint swelling and neck pain.  Skin:  Negative for rash.  Neurological: Negative.  Negative for tremors and numbness.  Hematological:  Negative for adenopathy. Does not bruise/bleed easily.  Psychiatric/Behavioral:  Negative for behavioral problems (Depression), sleep disturbance and suicidal ideas. The patient is not nervous/anxious.     Vital Signs: BP 104/61   Pulse 87   Temp 97.9 F (36.6 C)   Resp 16   Ht 5\' 7"  (1.702 m)   Wt 212 lb (96.2 kg)   SpO2 94%   BMI 33.20 kg/m    Physical Exam Vitals reviewed.  Constitutional:      General: He is not in acute distress.    Appearance: Normal appearance. He is obese. He is not ill-appearing.  HENT:     Head: Normocephalic and atraumatic.  Eyes:     Pupils: Pupils are equal,  round, and reactive to light.  Cardiovascular:     Rate and Rhythm: Normal rate and regular rhythm.  Pulmonary:     Effort: Pulmonary effort is normal. No respiratory distress.  Neurological:     Mental Status: He is alert and oriented to person, place, and time.  Psychiatric:        Mood and Affect: Mood normal.        Behavior: Behavior normal.        Assessment/Plan: 1. Essential (primary) hypertension Stable, continue medications as prescribed.   2. Generalized anxiety disorder Continue alprazolam as prescribed. - ALPRAZolam (XANAX) 0.5 MG tablet; Take 1 tablet (0.5 mg total) by mouth 2 (two) times daily as needed  for anxiety or sleep.  Dispense: 60 tablet; Refill: 2  3. Psychophysiological insomnia Continue ambien as prescribed - zolpidem (AMBIEN) 10 MG tablet; Take 1 tablet (10 mg total) by mouth at bedtime.  Dispense: 30 tablet; Refill: 2  4. Mixed hyperlipidemia Continue atorvastatin as prescribed.  - atorvastatin (LIPITOR) 40 MG tablet; Take 1 tablet (40 mg total) by mouth daily.  Dispense: 90 tablet; Refill: 3  5. Dorsalgia of lumbosacral region Continue prn tizanidine as prescribed - tiZANidine (ZANAFLEX) 4 MG tablet; Take 1 tablet (4 mg total) by mouth at bedtime.  Dispense: 30 tablet; Refill: 2   General Counseling: Lorin verbalizes understanding of the findings of todays visit and agrees with plan of treatment. I have discussed any further diagnostic evaluation that may be needed or ordered today. We also reviewed his medications today. he has been encouraged to call the office with any questions or concerns that should arise related to todays visit.    No orders of the defined types were placed in this encounter.   Meds ordered this encounter  Medications   ALPRAZolam (XANAX) 0.5 MG tablet    Sig: Take 1 tablet (0.5 mg total) by mouth 2 (two) times daily as needed for anxiety or sleep.    Dispense:  60 tablet    Refill:  2    Note change in frequency,  fill today   tiZANidine (ZANAFLEX) 4 MG tablet    Sig: Take 1 tablet (4 mg total) by mouth at bedtime.    Dispense:  30 tablet    Refill:  2   zolpidem (AMBIEN) 10 MG tablet    Sig: Take 1 tablet (10 mg total) by mouth at bedtime.    Dispense:  30 tablet    Refill:  2   atorvastatin (LIPITOR) 40 MG tablet    Sig: Take 1 tablet (40 mg total) by mouth daily.    Dispense:  90 tablet    Refill:  3    Maximum Refills Reached    Return in about 3 months (around 06/11/2023) for F/U, anxiety med refill, Sylena Lotter PCP.   Total time spent:30 Minutes Time spent includes review of chart, medications, test results, and follow up plan with the patient.   [place] Controlled Substance Database was reviewed by me.  This patient was seen by Jonetta Osgood, FNP-C in collaboration with Dr. Clayborn Bigness as a part of collaborative care agreement.   Lily Kernen R. Valetta Fuller, MSN, FNP-C Internal medicine

## 2023-04-01 ENCOUNTER — Encounter: Payer: Medicaid Other | Admitting: Nurse Practitioner

## 2023-06-13 ENCOUNTER — Encounter: Payer: Self-pay | Admitting: Nurse Practitioner

## 2023-06-13 ENCOUNTER — Ambulatory Visit: Payer: Medicaid Other | Admitting: Nurse Practitioner

## 2023-06-13 VITALS — BP 120/80 | HR 80 | Temp 98.4°F | Resp 16 | Ht 67.0 in | Wt 224.0 lb

## 2023-06-13 DIAGNOSIS — F411 Generalized anxiety disorder: Secondary | ICD-10-CM

## 2023-06-13 DIAGNOSIS — I700 Atherosclerosis of aorta: Secondary | ICD-10-CM

## 2023-06-13 DIAGNOSIS — R7303 Prediabetes: Secondary | ICD-10-CM | POA: Diagnosis not present

## 2023-06-13 DIAGNOSIS — Z125 Encounter for screening for malignant neoplasm of prostate: Secondary | ICD-10-CM | POA: Diagnosis not present

## 2023-06-13 DIAGNOSIS — F5104 Psychophysiologic insomnia: Secondary | ICD-10-CM

## 2023-06-13 DIAGNOSIS — Z79899 Other long term (current) drug therapy: Secondary | ICD-10-CM | POA: Diagnosis not present

## 2023-06-13 LAB — POCT URINE DRUG SCREEN
Methylenedioxyamphetamine: NOT DETECTED
POC Amphetamine UR: NOT DETECTED
POC BENZODIAZEPINES UR: NOT DETECTED
POC Barbiturate UR: NOT DETECTED
POC Cocaine UR: NOT DETECTED
POC Ecstasy UR: NOT DETECTED
POC Marijuana UR: POSITIVE — AB
POC Methadone UR: NOT DETECTED
POC Methamphetamine UR: NOT DETECTED
POC Opiate Ur: NOT DETECTED
POC Oxycodone UR: NOT DETECTED
POC PHENCYCLIDINE UR: NOT DETECTED
POC TRICYCLICS UR: NOT DETECTED

## 2023-06-13 MED ORDER — ZOLPIDEM TARTRATE 10 MG PO TABS: 10.0000 mg | ORAL_TABLET | Freq: Every day | ORAL | 2 refills | Status: DC

## 2023-06-13 MED ORDER — AMLODIPINE BESYLATE 10 MG PO TABS: 10.0000 mg | ORAL_TABLET | Freq: Every day | ORAL | 1 refills | Status: DC

## 2023-06-13 MED ORDER — ALPRAZOLAM 0.5 MG PO TABS: 0.5000 mg | ORAL_TABLET | Freq: Two times a day (BID) | ORAL | 2 refills | Status: DC | PRN

## 2023-06-13 MED ORDER — CITALOPRAM HYDROBROMIDE 40 MG PO TABS: ORAL_TABLET | ORAL | 1 refills | Status: DC

## 2023-06-13 MED ORDER — LISINOPRIL-HYDROCHLOROTHIAZIDE 20-12.5 MG PO TABS: 2.0000 | ORAL_TABLET | Freq: Every day | ORAL | 2 refills | Status: DC

## 2023-06-13 MED ORDER — TIZANIDINE HCL 4 MG PO TABS: 4.0000 mg | ORAL_TABLET | Freq: Every day | ORAL | 2 refills | Status: DC

## 2023-06-13 NOTE — Progress Notes (Signed)
[facility] [address] [place], [place] [address]  Internal MEDICINE  Office Visit Note  Patient Name: Dustin Heath  [id number]  [id number]  Date of Service: 06/13/2023  Chief Complaint  Patient presents with   Depression   Hyperlipidemia   Follow-up    Anxiety med refill    HPI Dustin Heath presents for a follow-up visit for hypertension, anxiety and insomnia.  Hypertension -- BP is elevated, reports that he ran out of his medication.  Anxiety -- taking alprazolam as needed Insomnia -- takes ambien for sleep High cholesterol -- taking atorvastatin Positive for THC but reports he uses it at home when he is trying to relax and is not working or using heavy machinery while using marijuana.  Overdue for routine physical exam and lab work -- will have this done at his next office visit.    Current Medication: Outpatient Encounter Medications as of 06/13/2023  Medication Sig   aspirin EC 81 MG tablet Take 81 mg by mouth daily.   atorvastatin (LIPITOR) 40 MG tablet Take 1 tablet (40 mg total) by mouth daily.   Cholecalciferol (VITAMIN D-3) 25 MCG (1000 UT) CAPS Take 1 capsule by mouth daily.   fluticasone (FLONASE) 50 MCG/ACT nasal spray Place 1 spray into both nostrils daily.   Garlic 1000 MG CAPS Take 1 capsule by mouth daily.   Magnesium 250 MG TABS Take 1 tablet by mouth daily.   Multiple Vitamins-Minerals (CENTRUM SILVER 50+MEN) TABS Take 1 tablet by mouth daily.   vitamin E 180 MG (400 UNITS) capsule Take 400 Units by mouth daily.   [DISCONTINUED] ALPRAZolam (XANAX) 0.5 MG tablet Take 1 tablet (0.5 mg total) by mouth 2 (two) times daily as needed for anxiety or sleep.   [DISCONTINUED] amLODipine (NORVASC) 10 MG tablet TAKE 1 TABLET (10 MG TOTAL) BY MOUTH DAILY.   [DISCONTINUED] citalopram (CELEXA) 40 MG tablet TAKE ONE TAB BY MOUTH DAILY FOR GENERALIZED ANXIETY DISORDER   [DISCONTINUED] cyclobenzaprine (FLEXERIL) 5 MG tablet Take 1 tablet (5 mg total) by mouth at  bedtime as needed for muscle spasms.   [DISCONTINUED] lisinopril-hydrochlorothiazide (ZESTORETIC) 20-12.5 MG tablet Take 2 tablets by mouth daily.   [DISCONTINUED] tiZANidine (ZANAFLEX) 4 MG tablet Take 1 tablet (4 mg total) by mouth at bedtime.   [DISCONTINUED] zolpidem (AMBIEN) 10 MG tablet Take 1 tablet (10 mg total) by mouth at bedtime.   ALPRAZolam (XANAX) 0.5 MG tablet Take 1 tablet (0.5 mg total) by mouth 2 (two) times daily as needed for anxiety or sleep.   amLODipine (NORVASC) 10 MG tablet Take 1 tablet (10 mg total) by mouth daily.   citalopram (CELEXA) 40 MG tablet TAKE ONE TAB BY MOUTH DAILY FOR GENERALIZED ANXIETY DISORDER   lisinopril-hydrochlorothiazide (ZESTORETIC) 20-12.5 MG tablet Take 2 tablets by mouth daily.   tiZANidine (ZANAFLEX) 4 MG tablet Take 1 tablet (4 mg total) by mouth at bedtime.   zolpidem (AMBIEN) 10 MG tablet Take 1 tablet (10 mg total) by mouth at bedtime.   No facility-administered encounter medications on file as of 06/13/2023.    Surgical History: Past Surgical History:  Procedure Laterality Date   APPENDECTOMY  63 yrs old   CATARACT EXTRACTION W/PHACO Right 05/11/2020   Procedure: CATARACT EXTRACTION PHACO AND INTRAOCULAR LENS PLACEMENT (IOC) RIGHT 4.59 00:40.1 11.4%;  Surgeon: Lockie Mola, MD;  Location: [facility];  Service: Ophthalmology;  Laterality: Right;   CATARACT EXTRACTION W/PHACO Left 06/08/2020   Procedure: CATARACT EXTRACTION PHACO AND INTRAOCULAR LENS PLACEMENT (IOC) LEFT  WITH ANTERIOR VITRECTOMY;  Surgeon: Lockie Mola, MD;  Location: [facility];  Service: Ophthalmology;  Laterality: Left;  4.47 0:35.8 12.4%   NOSE SURGERY  2000   Septorhinoplasty    Medical History: Past Medical History:  Diagnosis Date   Depression    Dyspnea    occasional with exertion   Hyperlipidemia    Hypertension    controlled on meds   Insomnia    Nasal obstruction    left side nasal-severe restriction of air flow    Orthopnea    Panic disorder    Sleep apnea    moderate-no CPAP    Family History: Family History  Problem Relation Age of Onset   Hypertension Mother    Hypertension Father     Social History   Socioeconomic History   Marital status: Single    Spouse name: Not on file   Number of children: Not on file   Years of education: Not on file   Highest education level: Not on file  Occupational History   Not on file  Tobacco Use   Smoking status: Every Day    Packs/day: 1.00    Years: 40.00    Additional pack years: 0.00    Total pack years: 40.00    Types: Cigarettes    Last attempt to quit: 06/20/2021    Years since quitting: 1.9   Smokeless tobacco: Never  Vaping Use   Vaping Use: Never used  Substance and Sexual Activity   Alcohol use: Never   Drug use: Never   Sexual activity: Not on file  Other Topics Concern   Not on file  Social History Narrative   Not on file   Social Determinants of Health   Financial Resource Strain: Not on file  Food Insecurity: Not on file  Transportation Needs: Not on file  Physical Activity: Not on file  Stress: Not on file  Social Connections: Not on file  Intimate Partner Violence: Not on file      Review of Systems  Constitutional:  Negative for chills, fatigue and unexpected weight change.  HENT:  Negative for congestion, rhinorrhea, sneezing and sore throat.   Eyes:  Negative for redness.  Respiratory:  Negative for cough, chest tightness and shortness of breath.   Cardiovascular:  Negative for chest pain and palpitations.  Gastrointestinal:  Negative for abdominal pain, constipation, diarrhea, nausea and vomiting.  Genitourinary:  Negative for dysuria and frequency.  Musculoskeletal:  Negative for arthralgias, back pain, joint swelling and neck pain.  Skin:  Negative for rash.  Neurological: Negative.  Negative for tremors and numbness.  Hematological:  Negative for adenopathy. Does not bruise/bleed easily.   Psychiatric/Behavioral:  Negative for behavioral problems (Depression), sleep disturbance and suicidal ideas. The patient is not nervous/anxious.     Vital Signs: BP 120/80 Comment: 150/96, 148/88 when rechecked.  Pulse 80   Temp 98.4 F (36.9 C)   Resp 16   Ht 5\' 7"  (1.702 m)   Wt 224 lb (101.6 kg)   SpO2 95%   BMI 35.08 kg/m    Physical Exam Vitals reviewed.  Constitutional:      General: He is not in acute distress.    Appearance: Normal appearance. He is obese. He is not ill-appearing.  HENT:     Head: Normocephalic and atraumatic.  Eyes:     Pupils: Pupils are equal, round, and reactive to light.  Cardiovascular:     Rate and Rhythm: Normal rate and regular rhythm.  Pulmonary:     Effort: Pulmonary effort is normal. No respiratory distress.  Neurological:     Mental Status: He is alert and oriented to person, place, and time.  Psychiatric:        Mood and Affect: Mood normal.        Behavior: Behavior normal.        Assessment/Plan: 1. Prediabetes Routine lab ordered - Hgb A1C w/o eAG  2. Aortic atherosclerosis (HCC) Routine labs ordered - CBC with Differential/Platelet - CMP14+EGFR - Lipid Profile - Hgb A1C w/o eAG  3. Screening for prostate cancer Routine PSA lab ordered - PSA Total (Reflex To Free)  4. Encounter for long-term (current) use of high-risk medication UDS positive for THC. Negative for benzos but patient reports that he had run out of alprazolam a few days ago.  - POCT Urine Drug Screen  5. Encounter for medication review Medication list reviewed, updated and refills ordered.  - lisinopril-hydrochlorothiazide (ZESTORETIC) 20-12.5 MG tablet; Take 2 tablets by mouth daily.  Dispense: 180 tablet; Refill: 2 - amLODipine (NORVASC) 10 MG tablet; Take 1 tablet (10 mg total) by mouth daily.  Dispense: 90 tablet; Refill: 1 - tiZANidine (ZANAFLEX) 4 MG tablet; Take 1 tablet (4 mg total) by mouth at bedtime.  Dispense: 30 tablet; Refill: 2 -  citalopram (CELEXA) 40 MG tablet; TAKE ONE TAB BY MOUTH DAILY FOR GENERALIZED ANXIETY DISORDER  Dispense: 90 tablet; Refill: 1  6. Psychophysiological insomnia Continue ambien as prescribed. Follow up in 3 months for additional refills.  - zolpidem (AMBIEN) 10 MG tablet; Take 1 tablet (10 mg total) by mouth at bedtime.  Dispense: 30 tablet; Refill: 2  7. Generalized anxiety disorder Continue prn alprazolam as prescribed, follow up in 3 months for refills.  - ALPRAZolam (XANAX) 0.5 MG tablet; Take 1 tablet (0.5 mg total) by mouth 2 (two) times daily as needed for anxiety or sleep.  Dispense: 60 tablet; Refill: 2   General Counseling: Dustin Heath verbalizes understanding of the findings of todays visit and agrees with plan of treatment. I have discussed any further diagnostic evaluation that may be needed or ordered today. We also reviewed his medications today. he has been encouraged to call the office with any questions or concerns that should arise related to todays visit.    Orders Placed This Encounter  Procedures   CBC with Differential/Platelet   CMP14+EGFR   Lipid Profile   Hgb A1C w/o eAG   PSA Total (Reflex To Free)   POCT Urine Drug Screen    Meds ordered this encounter  Medications   ALPRAZolam (XANAX) 0.5 MG tablet    Sig: Take 1 tablet (0.5 mg total) by mouth 2 (two) times daily as needed for anxiety or sleep.    Dispense:  60 tablet    Refill:  2    Note change in frequency, fill today   zolpidem (AMBIEN) 10 MG tablet    Sig: Take 1 tablet (10 mg total) by mouth at bedtime.    Dispense:  30 tablet    Refill:  2   lisinopril-hydrochlorothiazide (ZESTORETIC) 20-12.5 MG tablet    Sig: Take 2 tablets by mouth daily.    Dispense:  180 tablet    Refill:  2    Maximum Refills Reached   amLODipine (NORVASC) 10 MG tablet    Sig: Take 1 tablet (10 mg total) by mouth daily.    Dispense:  90 tablet    Refill:  1    Prescription Expired  tiZANidine (ZANAFLEX) 4 MG tablet     Sig: Take 1 tablet (4 mg total) by mouth at bedtime.    Dispense:  30 tablet    Refill:  2   citalopram (CELEXA) 40 MG tablet    Sig: TAKE ONE TAB BY MOUTH DAILY FOR GENERALIZED ANXIETY DISORDER    Dispense:  90 tablet    Refill:  1    Maximum Refills Reached    Return in about 3 months (around 09/06/2023) for CPE, Manaia Samad PCP and anxiety med refills. .   Total time spent:30 Minutes Time spent includes review of chart, medications, test results, and follow up plan with the patient.   [place] Controlled Substance Database was reviewed by me.  This patient was seen by Sallyanne Kuster, FNP-C in collaboration with Dr. Beverely Risen as a part of collaborative care agreement.   Yamilee Harmes R. Tedd Sias, MSN, FNP-C Internal medicine

## 2023-07-12 ENCOUNTER — Telehealth: Payer: Self-pay

## 2023-07-12 NOTE — Telephone Encounter (Signed)
Completed PA for patient's Ambien. 

## 2023-09-09 ENCOUNTER — Encounter: Payer: Self-pay | Admitting: Nurse Practitioner

## 2023-09-09 ENCOUNTER — Ambulatory Visit: Payer: MEDICAID | Admitting: Nurse Practitioner

## 2023-09-09 VITALS — BP 130/78 | HR 86 | Temp 98.5°F | Resp 16 | Ht 67.0 in | Wt 220.4 lb

## 2023-09-09 DIAGNOSIS — R918 Other nonspecific abnormal finding of lung field: Secondary | ICD-10-CM

## 2023-09-09 DIAGNOSIS — R7303 Prediabetes: Secondary | ICD-10-CM | POA: Diagnosis not present

## 2023-09-09 DIAGNOSIS — I700 Atherosclerosis of aorta: Secondary | ICD-10-CM | POA: Diagnosis not present

## 2023-09-09 DIAGNOSIS — Z0001 Encounter for general adult medical examination with abnormal findings: Secondary | ICD-10-CM | POA: Diagnosis not present

## 2023-09-09 DIAGNOSIS — R300 Dysuria: Secondary | ICD-10-CM

## 2023-09-09 DIAGNOSIS — F99 Mental disorder, not otherwise specified: Secondary | ICD-10-CM

## 2023-09-09 DIAGNOSIS — F411 Generalized anxiety disorder: Secondary | ICD-10-CM

## 2023-09-09 DIAGNOSIS — F17210 Nicotine dependence, cigarettes, uncomplicated: Secondary | ICD-10-CM

## 2023-09-09 DIAGNOSIS — F5104 Psychophysiologic insomnia: Secondary | ICD-10-CM

## 2023-09-09 DIAGNOSIS — Z79899 Other long term (current) drug therapy: Secondary | ICD-10-CM

## 2023-09-09 DIAGNOSIS — F331 Major depressive disorder, recurrent, moderate: Secondary | ICD-10-CM

## 2023-09-09 DIAGNOSIS — F5105 Insomnia due to other mental disorder: Secondary | ICD-10-CM

## 2023-09-09 MED ORDER — AMLODIPINE BESYLATE 10 MG PO TABS: 10.0000 mg | ORAL_TABLET | Freq: Every day | ORAL | 1 refills | Status: DC

## 2023-09-09 MED ORDER — CITALOPRAM HYDROBROMIDE 40 MG PO TABS: ORAL_TABLET | ORAL | 1 refills | Status: DC

## 2023-09-09 MED ORDER — TIZANIDINE HCL 4 MG PO TABS: 4.0000 mg | ORAL_TABLET | Freq: Every day | ORAL | 2 refills | Status: DC

## 2023-09-09 MED ORDER — ALPRAZOLAM 0.5 MG PO TABS: 0.5000 mg | ORAL_TABLET | Freq: Three times a day (TID) | ORAL | 2 refills | Status: DC | PRN

## 2023-09-09 MED ORDER — ZOLPIDEM TARTRATE 10 MG PO TABS: 10.0000 mg | ORAL_TABLET | Freq: Every day | ORAL | 2 refills | Status: DC

## 2023-09-09 MED ORDER — LISINOPRIL-HYDROCHLOROTHIAZIDE 20-12.5 MG PO TABS: 2.0000 | ORAL_TABLET | Freq: Every day | ORAL | 2 refills | Status: DC

## 2023-09-09 NOTE — Progress Notes (Signed)
[facility] [address] [place], [place] [address]  Internal MEDICINE  Office Visit Note  Patient Name: Dustin Heath  [id number]  [id number]  Date of Service: 09/09/2023  Chief Complaint  Patient presents with   Depression   Hyperlipidemia   Annual Exam    HPI Dustin Heath presents for an annual well visit and physical exam.  Well-appearing 63 y.o. male with prediabetes, aortic atherosclerosis, anxiety, depression, hypertension, hyperlipidemia, and insomnia  Routine CRC screening: due march 2025.  Eye exam: sees his eye doctor regularly  Foot exam: done today Labs: patient reminded that he needs to get his labs drawn, labs were ordered in june New or worsening pain: chronic back pain, no new pains  Other concerns: reports feeling depressed, lonely and hopeless, denies any thoughts of suicide or self-harm. Is very focused on some negative things that happened several years ago and he mentions them at every office visit but is even more focused on these events today which has caused the patient to have an even more depressed mood and sad affect then his usual presentation.  --He continues to smoke cigarettes, despite the risk of lung cancer and the lung nodules that were found found on his CT chest from a couple of years ago.      09/09/2023    1:10 PM  Depression screen PHQ 2/9  Decreased Interest 2  Down, Depressed, Hopeless 3  PHQ - 2 Score 5  Altered sleeping 3  Tired, decreased energy 3  Change in appetite 0  Feeling bad or failure about yourself  3  Trouble concentrating 1  Moving slowly or fidgety/restless 0  Suicidal thoughts 0  PHQ-9 Score 15  Difficult doing work/chores Very difficult     Current Medication: Outpatient Encounter Medications as of 09/09/2023  Medication Sig   aspirin EC 81 MG tablet Take 81 mg by mouth daily.   atorvastatin (LIPITOR) 40 MG tablet Take 1 tablet (40 mg total) by mouth daily.   Cholecalciferol (VITAMIN D-3) 25 MCG (1000 UT)  CAPS Take 1 capsule by mouth daily.   fluticasone (FLONASE) 50 MCG/ACT nasal spray Place 1 spray into both nostrils daily.   Garlic 1000 MG CAPS Take 1 capsule by mouth daily.   Magnesium 250 MG TABS Take 1 tablet by mouth daily.   Multiple Vitamins-Minerals (CENTRUM SILVER 50+MEN) TABS Take 1 tablet by mouth daily.   vitamin E 180 MG (400 UNITS) capsule Take 400 Units by mouth daily.   [DISCONTINUED] ALPRAZolam (XANAX) 0.5 MG tablet Take 1 tablet (0.5 mg total) by mouth 2 (two) times daily as needed for anxiety or sleep.   [DISCONTINUED] amLODipine (NORVASC) 10 MG tablet Take 1 tablet (10 mg total) by mouth daily.   [DISCONTINUED] citalopram (CELEXA) 40 MG tablet TAKE ONE TAB BY MOUTH DAILY FOR GENERALIZED ANXIETY DISORDER   [DISCONTINUED] lisinopril-hydrochlorothiazide (ZESTORETIC) 20-12.5 MG tablet Take 2 tablets by mouth daily.   [DISCONTINUED] tiZANidine (ZANAFLEX) 4 MG tablet Take 1 tablet (4 mg total) by mouth at bedtime.   [DISCONTINUED] zolpidem (AMBIEN) 10 MG tablet Take 1 tablet (10 mg total) by mouth at bedtime.   ALPRAZolam (XANAX) 0.5 MG tablet Take 1 tablet (0.5 mg total) by mouth 3 (three) times daily as needed for anxiety or sleep.   amLODipine (NORVASC) 10 MG tablet Take 1 tablet (10 mg total) by mouth daily.   citalopram (CELEXA) 40 MG tablet TAKE ONE TAB BY MOUTH DAILY FOR GENERALIZED ANXIETY DISORDER   lisinopril-hydrochlorothiazide (ZESTORETIC) 20-12.5 MG tablet  Take 2 tablets by mouth daily.   tiZANidine (ZANAFLEX) 4 MG tablet Take 1 tablet (4 mg total) by mouth at bedtime.   zolpidem (AMBIEN) 10 MG tablet Take 1 tablet (10 mg total) by mouth at bedtime.   No facility-administered encounter medications on file as of 09/09/2023.    Surgical History: Past Surgical History:  Procedure Laterality Date   APPENDECTOMY  63 yrs old   CATARACT EXTRACTION W/PHACO Right 05/11/2020   Procedure: CATARACT EXTRACTION PHACO AND INTRAOCULAR LENS PLACEMENT (IOC) RIGHT 4.59 00:40.1 11.4%;   Surgeon: Lockie Mola, MD;  Location: [facility];  Service: Ophthalmology;  Laterality: Right;   CATARACT EXTRACTION W/PHACO Left 06/08/2020   Procedure: CATARACT EXTRACTION PHACO AND INTRAOCULAR LENS PLACEMENT (IOC) LEFT WITH ANTERIOR VITRECTOMY;  Surgeon: Lockie Mola, MD;  Location: [facility];  Service: Ophthalmology;  Laterality: Left;  4.47 0:35.8 12.4%   NOSE SURGERY  2000   Septorhinoplasty    Medical History: Past Medical History:  Diagnosis Date   Depression    Dyspnea    occasional with exertion   Hyperlipidemia    Hypertension    controlled on meds   Insomnia    Nasal obstruction    left side nasal-severe restriction of air flow   Orthopnea    Panic disorder    Sleep apnea    moderate-no CPAP    Family History: Family History  Problem Relation Age of Onset   Hypertension Mother    Hypertension Father     Social History   Socioeconomic History   Marital status: Single    Spouse name: Not on file   Number of children: Not on file   Years of education: Not on file   Highest education level: Not on file  Occupational History   Not on file  Tobacco Use   Smoking status: Every Day    Current packs/day: 0.00    Average packs/day: 1 pack/day for 40.0 years (40.0 ttl pk-yrs)    Types: Cigarettes    Start date: 06/20/1981    Last attempt to quit: 06/20/2021    Years since quitting: 2.2   Smokeless tobacco: Never  Vaping Use   Vaping status: Never Used  Substance and Sexual Activity   Alcohol use: Never   Drug use: Never   Sexual activity: Not on file  Other Topics Concern   Not on file  Social History Narrative   Not on file   Social Determinants of Health   Financial Resource Strain: Not on file  Food Insecurity: Not on file  Transportation Needs: Not on file  Physical Activity: Not on file  Stress: Not on file  Social Connections: Not on file  Intimate Partner Violence: Not on file      Review of Systems   Constitutional:  Positive for fatigue and unexpected weight change. Negative for activity change, appetite change, chills and fever.  HENT: Negative.  Negative for congestion, ear pain, rhinorrhea, sore throat and trouble swallowing.   Eyes:  Positive for visual disturbance.  Respiratory: Negative.  Negative for cough, chest tightness, shortness of breath and wheezing.   Cardiovascular:  Negative for chest pain and palpitations.  Gastrointestinal: Negative.  Negative for abdominal pain, blood in stool, constipation, diarrhea, nausea and vomiting.  Endocrine: Negative.   Genitourinary: Negative.  Negative for difficulty urinating, dysuria, frequency, hematuria and urgency.  Musculoskeletal: Negative.  Negative for arthralgias, back pain, joint swelling, myalgias and neck pain.  Skin: Negative.  Negative for rash and wound.  Allergic/Immunologic: Negative.  Negative for immunocompromised state.  Neurological:  Negative for dizziness, seizures, numbness and headaches.  Hematological: Negative.   Psychiatric/Behavioral:  Positive for behavioral problems, dysphoric mood and sleep disturbance (reports poor sleep due to having a mattress that is too firm and states he cannot afford to buy a new one). Negative for self-injury and suicidal ideas. The patient is nervous/anxious.     Vital Signs: BP 130/78   Pulse 86   Temp 98.5 F (36.9 C)   Resp 16   Ht 5\' 7"  (1.702 m)   Wt 220 lb 6.4 oz (100 kg)   SpO2 94%   BMI 34.52 kg/m    Physical Exam Vitals reviewed.  Constitutional:      General: He is awake. He is not in acute distress.    Appearance: Normal appearance. He is well-developed and well-groomed. He is obese. He is not ill-appearing or diaphoretic.  HENT:     Head: Normocephalic and atraumatic.     Right Ear: Tympanic membrane, ear canal and external ear normal.     Left Ear: Tympanic membrane, ear canal and external ear normal.     Nose: Nose normal. No congestion or rhinorrhea.      Mouth/Throat:     Lips: Pink.     Mouth: Mucous membranes are moist.     Pharynx: Oropharynx is clear. Uvula midline. No oropharyngeal exudate or posterior oropharyngeal erythema.  Eyes:     General: Lids are normal. Vision grossly intact. Gaze aligned appropriately. No scleral icterus.       Right eye: No discharge.        Left eye: No discharge.     Extraocular Movements: Extraocular movements intact.     Conjunctiva/sclera: Conjunctivae normal.     Pupils: Pupils are equal, round, and reactive to light.     Funduscopic exam:    Right eye: Red reflex present.        Left eye: Red reflex present. Neck:     Thyroid: No thyromegaly.     Vascular: No JVD.     Trachea: Trachea and phonation normal. No tracheal deviation.  Cardiovascular:     Rate and Rhythm: Normal rate and regular rhythm.     Pulses: Normal pulses.     Heart sounds: Normal heart sounds, S1 normal and S2 normal. No murmur heard.    No friction rub. No gallop.  Pulmonary:     Effort: Pulmonary effort is normal. No accessory muscle usage or respiratory distress.     Breath sounds: Normal breath sounds and air entry. No stridor. No wheezing or rales.  Chest:     Chest wall: No tenderness.  Abdominal:     General: Bowel sounds are normal. There is no distension.     Palpations: Abdomen is soft. There is no shifting dullness, fluid wave, mass or pulsatile mass.     Tenderness: There is no abdominal tenderness. There is no guarding or rebound.  Musculoskeletal:        General: No tenderness or deformity. Normal range of motion.     Cervical back: Normal range of motion and neck supple.     Right lower leg: No edema.     Left lower leg: No edema.  Lymphadenopathy:     Cervical: No cervical adenopathy.  Skin:    General: Skin is warm and dry.     Capillary Refill: Capillary refill takes less than 2 seconds.     Coloration: Skin is not pale.  Findings: No erythema or rash.  Neurological:     Mental Status: He  is alert and oriented to person, place, and time.     Cranial Nerves: No cranial nerve deficit.     Motor: No abnormal muscle tone.     Coordination: Coordination normal.     Deep Tendon Reflexes: Reflexes are normal and symmetric.  Psychiatric:        Attention and Perception: Perception normal. He is inattentive.        Mood and Affect: Mood is anxious and depressed. Affect is labile and tearful.        Speech: Speech is delayed.        Behavior: Behavior is slowed and withdrawn. Behavior is cooperative.        Thought Content: Thought content normal. Thought content is not paranoid or delusional. Thought content does not include homicidal or suicidal ideation.        Cognition and Memory: Cognition and memory normal.        Judgment: Judgment is impulsive.        Assessment/Plan: 1. Encounter for routine adult health examination with abnormal findings Age-appropriate preventive screenings and vaccinations discussed, annual physical exam completed. Routine labs for health maintenance ordered in June this year, patient reminded to have his labs drawn at his earliest convenience. PHM updated.  - citalopram (CELEXA) 40 MG tablet; TAKE ONE TAB BY MOUTH DAILY FOR GENERALIZED ANXIETY DISORDER  Dispense: 90 tablet; Refill: 1 - amLODipine (NORVASC) 10 MG tablet; Take 1 tablet (10 mg total) by mouth daily.  Dispense: 90 tablet; Refill: 1 - lisinopril-hydrochlorothiazide (ZESTORETIC) 20-12.5 MG tablet; Take 2 tablets by mouth daily.  Dispense: 180 tablet; Refill: 2 - tiZANidine (ZANAFLEX) 4 MG tablet; Take 1 tablet (4 mg total) by mouth at bedtime.  Dispense: 30 tablet; Refill: 2  2. Prediabetes Continue working on [employer]. Patient is limited due to financial reasons with what he can and cannot get for food items.   3. Aortic atherosclerosis (HCC) Continue atorvastatin 40 mg daily as prescribed.   4. Multiple pulmonary nodules determined by computed tomography of lung Routine  lung cancer screening, low dose CT chest ordered  - CT CHEST LUNG CA SCREEN LOW DOSE W/O CM; Future  5. Dysuria Routine urinalysis done  - UA/M w/rflx Culture, Routine - Microscopic Examination  6. Moderate episode of recurrent major depressive disorder [facility]) Discussed current status and possibility of referring to psychiatry. Not ready yet but will call clinic when he is. Denied any thoughts of suicide or self harm. Currently taking citalopram, continue as prescribed.   7. Generalized anxiety disorder Continue alprazolam as prescribed. Follow up in 3 months for additional refills  - ALPRAZolam (XANAX) 0.5 MG tablet; Take 1 tablet (0.5 mg total) by mouth 3 (three) times daily as needed for anxiety or sleep.  Dispense: 90 tablet; Refill: 2  8. Insomnia due to other mental disorder Continue ambien as prescribed, follow up in 3 months for additional refills  - zolpidem (AMBIEN) 10 MG tablet; Take 1 tablet (10 mg total) by mouth at bedtime.  Dispense: 30 tablet; Refill: 2  9. Smokes less than 2 packs a day with greater than 40 pack year history Due for lung cancer screening, low dose CT chest ordered  - CT CHEST LUNG CA SCREEN LOW DOSE W/O CM; Future      General Counseling: Zuri verbalizes understanding of the findings of todays visit and agrees with plan of treatment. I have discussed any  further diagnostic evaluation that may be needed or ordered today. We also reviewed his medications today. he has been encouraged to call the office with any questions or concerns that should arise related to todays visit.    Orders Placed This Encounter  Procedures   CT CHEST LUNG CA SCREEN LOW DOSE W/O CM    Meds ordered this encounter  Medications   citalopram (CELEXA) 40 MG tablet    Sig: TAKE ONE TAB BY MOUTH DAILY FOR GENERALIZED ANXIETY DISORDER    Dispense:  90 tablet    Refill:  1    Maximum Refills Reached   ALPRAZolam (XANAX) 0.5 MG tablet    Sig: Take 1 tablet (0.5 mg total) by  mouth 3 (three) times daily as needed for anxiety or sleep.    Dispense:  90 tablet    Refill:  2    Note change in frequency, fill today   amLODipine (NORVASC) 10 MG tablet    Sig: Take 1 tablet (10 mg total) by mouth daily.    Dispense:  90 tablet    Refill:  1    Prescription Expired   zolpidem (AMBIEN) 10 MG tablet    Sig: Take 1 tablet (10 mg total) by mouth at bedtime.    Dispense:  30 tablet    Refill:  2   lisinopril-hydrochlorothiazide (ZESTORETIC) 20-12.5 MG tablet    Sig: Take 2 tablets by mouth daily.    Dispense:  180 tablet    Refill:  2    Maximum Refills Reached   tiZANidine (ZANAFLEX) 4 MG tablet    Sig: Take 1 tablet (4 mg total) by mouth at bedtime.    Dispense:  30 tablet    Refill:  2    Return in about 12 weeks (around 12/02/2023) for F/U, anxiety med refill, Genene Kilman PCP alprazolam and ambien .   Total time spent:30 Minutes Time spent includes review of chart, medications, test results, and follow up plan with the patient.   [place] Controlled Substance Database was reviewed by me.  This patient was seen by Sallyanne Kuster, FNP-C in collaboration with Dr. Beverely Risen as a part of collaborative care agreement.  Maurina Fawaz R. Tedd Sias, MSN, FNP-C Internal medicine

## 2023-09-10 LAB — UA/M W/RFLX CULTURE, ROUTINE
Bilirubin, UA: NEGATIVE
Glucose, UA: NEGATIVE
Ketones, UA: NEGATIVE
Leukocytes,UA: NEGATIVE
Nitrite, UA: NEGATIVE
Protein,UA: NEGATIVE
RBC, UA: NEGATIVE
Specific Gravity, UA: 1.015 (ref 1.005–1.030)
Urobilinogen, Ur: 0.2 mg/dL (ref 0.2–1.0)
pH, UA: 7 (ref 5.0–7.5)

## 2023-09-10 LAB — MICROSCOPIC EXAMINATION
Bacteria, UA: NONE SEEN
Casts: NONE SEEN /LPF
Epithelial Cells (non renal): NONE SEEN /HPF (ref 0–10)
RBC, Urine: NONE SEEN /HPF (ref 0–2)
WBC, UA: NONE SEEN /HPF (ref 0–5)

## 2023-09-20 ENCOUNTER — Telehealth: Payer: Self-pay | Admitting: Nurse Practitioner

## 2023-09-20 NOTE — Telephone Encounter (Signed)
Lvm regarding CT ordered-Toni

## 2023-09-30 ENCOUNTER — Telehealth: Payer: Self-pay | Admitting: Nurse Practitioner

## 2023-09-30 NOTE — Telephone Encounter (Signed)
Left patient vm to return my call regarding CT-Toni

## 2023-10-10 ENCOUNTER — Telehealth: Payer: Self-pay | Admitting: Nurse Practitioner

## 2023-10-10 NOTE — Telephone Encounter (Signed)
Lvm to follow up on chest CT ordered back in [facility]

## 2023-10-16 ENCOUNTER — Telehealth: Payer: Self-pay | Admitting: Nurse Practitioner

## 2023-10-16 NOTE — Telephone Encounter (Signed)
Left another vm regarding CT ordered-Toni

## 2023-10-20 ENCOUNTER — Encounter: Payer: Self-pay | Admitting: Nurse Practitioner

## 2023-11-01 DEATH — deceased

## 2023-12-02 ENCOUNTER — Ambulatory Visit: Payer: MEDICAID | Admitting: Nurse Practitioner

## 2024-09-14 ENCOUNTER — Encounter: Payer: MEDICAID | Admitting: Nurse Practitioner
